# Patient Record
Sex: Female | Born: 2000 | Race: Asian | Hispanic: No | Smoking: Never smoker
Health system: Southern US, Community
[De-identification: ages and names within clinical notes are randomized; demographics above are authoritative.]

## PROBLEM LIST (undated history)

## (undated) DIAGNOSIS — Z789 Other specified health status: Secondary | ICD-10-CM

## (undated) HISTORY — PX: NO PAST SURGERIES: SHX2092

## (undated) HISTORY — DX: Other specified health status: Z78.9

---

## 2020-06-13 ENCOUNTER — Other Ambulatory Visit: Payer: Self-pay

## 2020-06-13 ENCOUNTER — Ambulatory Visit (INDEPENDENT_AMBULATORY_CARE_PROVIDER_SITE_OTHER): Payer: BC Managed Care – PPO | Admitting: *Deleted

## 2020-06-13 ENCOUNTER — Encounter: Payer: Self-pay | Admitting: General Practice

## 2020-06-13 VITALS — BP 123/71 | HR 88 | Temp 98.0°F | Ht 65.0 in | Wt 119.6 lb

## 2020-06-13 DIAGNOSIS — Z3400 Encounter for supervision of normal first pregnancy, unspecified trimester: Secondary | ICD-10-CM | POA: Insufficient documentation

## 2020-06-13 DIAGNOSIS — Z3201 Encounter for pregnancy test, result positive: Secondary | ICD-10-CM

## 2020-06-13 LAB — POCT URINE PREGNANCY: Preg Test, Ur: POSITIVE — AB

## 2020-06-13 NOTE — Progress Notes (Signed)
   PRENATAL INTAKE SUMMARY  Ms. Bloch presents today New OB Nurse Interview.  OB History    Gravida  1   Para      Term      Preterm      AB      Living        SAB      TAB      Ectopic      Multiple      Live Births             I have reviewed the patient's medical, obstetrical, social, and family histories, medications, and available lab results.  SUBJECTIVE She has no unusual complaints. Positive Home UPT  OBJECTIVE Initial nurse interview (New OB)  EDD: 02/04/21 by [facility]MP GA: [redacted]w[redacted]d G1P0  GENERAL APPEARANCE: alert, well appearing, in no apparent distress, oriented to person, place and time   ASSESSMENT Positive UPT Normal pregnancy  PLAN Prenatal care-[facility] to be completed at next visit with provider at [facility].  Clovis Pu, RN

## 2020-06-13 NOTE — Patient Instructions (Addendum)
First Trimester of Pregnancy  The first trimester of pregnancy is from week 1 until the end of week 13 (months 1 through 3). During this time, your baby will begin to develop inside you. At 6-8 weeks, the eyes and face are formed, and the heartbeat can be seen on ultrasound. At the end of 12 weeks, all the baby's organs are formed. Prenatal care is all the medical care you receive before the birth of your baby. Make sure you get good prenatal care and follow all of your doctor's instructions. Follow these instructions at home: Medicines  Take over-the-counter and prescription medicines only as told by your doctor. Some medicines are safe and some medicines are not safe during pregnancy.  Take a prenatal vitamin that contains at least 600 micrograms (mcg) of folic acid.  If you have trouble pooping (constipation), take medicine that will make your stool soft (stool softener) if your doctor approves. Eating and drinking   Eat regular, healthy meals.  Your doctor will tell you the amount of weight gain that is right for you.  Avoid raw meat and uncooked cheese.  If you feel sick to your stomach (nauseous) or throw up (vomit): ? Eat 4 or 5 small meals a day instead of 3 large meals. ? Try eating a few soda crackers. ? Drink liquids between meals instead of during meals.  To prevent constipation: ? Eat foods that are high in fiber, like fresh fruits and vegetables, whole grains, and beans. ? Drink enough fluids to keep your pee (urine) clear or pale yellow. Activity  Exercise only as told by your doctor. Stop exercising if you have cramps or pain in your lower belly (abdomen) or low back.  Do not exercise if it is too hot, too humid, or if you are in a place of great height (high altitude).  Try to avoid standing for long periods of time. Move your legs often if you must stand in one place for a long time.  Avoid heavy lifting.  Wear low-heeled shoes. Sit and stand up  straight.  You can have sex unless your doctor tells you not to. Relieving pain and discomfort  Wear a good support bra if your breasts are sore.  Take warm water baths (sitz baths) to soothe pain or discomfort caused by hemorrhoids. Use hemorrhoid cream if your doctor says it is okay.  Rest with your legs raised if you have leg cramps or low back pain.  If you have puffy, bulging veins (varicose veins) in your legs: ? Wear support hose or compression stockings as told by your doctor. ? Raise (elevate) your feet for 15 minutes, 3-4 times a day. ? Limit salt in your food. Prenatal care  Schedule your prenatal visits by the twelfth week of pregnancy.  Write down your questions. Take them to your prenatal visits.  Keep all your prenatal visits as told by your doctor. This is important. Safety  Wear your seat belt at all times when driving.  Make a list of emergency phone numbers. The list should include numbers for family, friends, the hospital, and police and fire departments. General instructions  Ask your doctor for a referral to a local prenatal class. Begin classes no later than at the start of month 6 of your pregnancy.  Ask for help if you need counseling or if you need help with nutrition. Your doctor can give you advice or tell you where to go for help.  Do not use hot tubs, steam   tubs, steam rooms, or saunas.  Do not douche or use tampons or scented sanitary pads.  Do not cross your legs for long periods of time.  Avoid all herbs and alcohol. Avoid drugs that are not approved by your doctor.  Do not use any tobacco products, including cigarettes, chewing tobacco, and electronic cigarettes. If you need help quitting, ask your doctor. You may get counseling or other support to help you quit.  Avoid cat litter boxes and soil used by cats. These carry germs that can cause birth defects in the baby and can cause a loss of your baby (miscarriage) or stillbirth.  Visit your dentist.  At home, brush your teeth with a soft toothbrush. Be gentle when you floss. Contact a doctor if:  You are dizzy.  You have mild cramps or pressure in your lower belly.  You have a nagging pain in your belly area.  You continue to feel sick to your stomach, you throw up, or you have watery poop (diarrhea).  You have a bad smelling fluid coming from your vagina.  You have pain when you pee (urinate).  You have increased puffiness (swelling) in your face, hands, legs, or ankles. Get help right away if:  You have a fever.  You are leaking fluid from your vagina.  You have spotting or bleeding from your vagina.  You have very bad belly cramping or pain.  You gain or lose weight rapidly.  You throw up blood. It may look like coffee grounds.  You are around people who have [place] measles, fifth disease, or chickenpox.  You have a very bad headache.  You have shortness of breath.  You have any kind of trauma, such as from a fall or a car accident. Summary  The first trimester of pregnancy is from week 1 until the end of week 13 (months 1 through 3).  To take care of yourself and your unborn baby, you will need to eat healthy meals, take medicines only if your doctor tells you to do so, and do activities that are safe for you and your baby.  Keep all follow-up visits as told by your doctor. This is important as your doctor will have to ensure that your baby is healthy and growing well. This information is not intended to replace advice given to you by your health care provider. Make sure you discuss any questions you have with your health care provider. Document Revised: 12/25/2018 Document Reviewed: 09/11/2016 Elsevier Patient Education  2020 [employer].  Warning Signs During Pregnancy A pregnancy lasts about 40 weeks, starting from the first day of your last period until the baby is born. Pregnancy is divided into three phases called trimesters.  The first trimester  refers to week 1 through week 13 of pregnancy.  The second trimester is the start of week 14 through the end of week 27.  The third trimester is the start of week 28 until you deliver your baby. During each trimester of pregnancy, certain signs and symptoms may indicate a problem. Talk with your health care provider about your current health and any medical conditions you have. Make sure you know the symptoms that you should watch for and report. How does this affect me?  Warning signs in the first trimester While some changes during the first trimester may be uncomfortable, most do not represent a serious problem. Let your health care provider know if you have any of the following warning signs in the first trimester:  You cannot eat or drink without vomiting, and this lasts for longer than a day.  You have vaginal bleeding or spotting along with menstrual-like cramping.  You have diarrhea for longer than a day.  You have a fever or other signs of infection, such as: ? Pain or burning when you urinate. ? Foul smelling or thick or yellowish vaginal discharge. Warning signs in the second trimester As your baby grows and changes during the second trimester, there are additional signs and symptoms that may indicate a problem. These include:  Signs and symptoms of infection, including a fever.  Signs or symptoms of a miscarriage or preterm labor, such as regular contractions, menstrual-like cramping, or lower abdominal pain.  Bloody or watery vaginal discharge or obvious vaginal bleeding.  Feeling like your heart is pounding.  Having trouble breathing.  Nausea, vomiting, or diarrhea that lasts for longer than a day.  Craving non-food items, such as clay, chalk, or dirt. This may be a sign of a very treatable medical condition called pica. Later in your second trimester, watch for signs and symptoms of a serious medical condition called preeclampsia.These include:  Changes in your  vision.  A severe headache that does not go away.  Nausea and vomiting. It is also important to notice if your baby stops moving or moves less than usual during this time. Warning signs in the third trimester As you approach the third trimester, your baby is growing and your body is preparing for the birth of your baby. In your third trimester, be sure to let your health care provider know if:  You have signs and symptoms of infection, including a fever.  You have vaginal bleeding.  You notice that your baby is moving less than usual or is not moving.  You have nausea, vomiting, or diarrhea that lasts for longer than a day.  You have a severe headache that does not go away.  You have vision changes, including seeing spots or having blurry or double vision.  You have increased swelling in your hands or face. How does this affect my baby? Throughout your pregnancy, always report any of the warning signs of a problem to your health care provider. This can help prevent complications that may affect your baby, including:  Increased risk for premature birth.  Infection that may be transmitted to your baby.  Increased risk for stillbirth. Contact a health care provider if:  You have any of the warning signs of a problem for the current trimester of your pregnancy.  Any of the following apply to you during any trimester of pregnancy: ? You have strong emotions, such as sadness or anxiety, that interfere with work or personal relationships. ? You feel unsafe in your home and need help finding a safe place to live. ? You are using tobacco products, alcohol, or drugs and you need help to stop. Get help right away if: You have signs or symptoms of labor before 37 weeks of pregnancy. These include:  Contractions that are 5 minutes or less apart, or that increase in frequency, intensity, or length.  Sudden, sharp abdominal pain or low back pain.  Uncontrolled gush or trickle of fluid  from your vagina. Summary  A pregnancy lasts about 40 weeks, starting from the first day of your last period until the baby is born. Pregnancy is divided into three phases called trimesters. Each trimester has warning signs to watch for.  Always report any warning signs to your health care provider in  order to prevent complications that may affect both you and your baby.  Talk with your health care provider about your current health and any medical conditions you have. Make sure you know the symptoms that you should watch for and report. This information is not intended to replace advice given to you by your health care provider. Make sure you discuss any questions you have with your health care provider. Document Revised: 12/23/2018 Document Reviewed: 06/20/2017 Elsevier Patient Education  2020 [employer].  Eating Plan for Pregnant Women While you are pregnant, your body requires additional nutrition to help support your growing baby. You also have a higher need for some vitamins and minerals, such as folic acid, calcium, iron, and vitamin D. Eating a healthy, well-balanced diet is very important for your health and your baby's health. Your need for extra calories varies for the three 36-month segments of your pregnancy (trimesters). For most women, it is recommended to consume:  150 extra calories a day during the first trimester.  300 extra calories a day during the second trimester.  300 extra calories a day during the third trimester. What are tips for following this plan?   Do not try to lose weight or go on a diet during pregnancy.  Limit your overall intake of foods that have "empty calories." These are foods that have little nutritional value, such as sweets, desserts, candies, and sugar-sweetened beverages.  Eat a variety of foods (especially fruits and vegetables) to get a full range of vitamins and minerals.  Take a prenatal vitamin to help meet your additional vitamin and  mineral needs during pregnancy, specifically for folic acid, iron, calcium, and vitamin D.  Remember to stay active. Ask your health care provider what types of exercise and activities are safe for you.  Practice good food safety and cleanliness. Wash your hands before you eat and after you prepare raw meat. Wash all fruits and vegetables well before peeling or eating. Taking these actions can help to prevent food-borne illnesses that can be very dangerous to your baby, such as listeriosis. Ask your health care provider for more information about listeriosis. What does 150 extra calories look like? Healthy options that provide 150 extra calories each day could be any of the following:  6-8 oz (170-230 g) of plain low-fat yogurt with  cup of berries.  1 apple with 2 teaspoons (11 g) of peanut butter.  Cut-up vegetables with  cup (60 g) of hummus.  8 oz (230 mL) or 1 cup of low-fat chocolate milk.  1 stick of string cheese with 1 medium orange.  1 peanut butter and jelly sandwich that is made with one slice of whole-wheat bread and 1 tsp (5 g) of peanut butter. For 300 extra calories, you could eat two of those healthy options each day. What is a healthy amount of weight to gain? The right amount of weight gain for you is based on your BMI before you became pregnant. If your BMI:  Was less than 18 (underweight), you should gain 28-40 lb (13-18 kg).  Was 18-24.9 (normal), you should gain 25-35 lb (11-16 kg).  Was 25-29.9 (overweight), you should gain 15-25 lb (7-11 kg).  Was 30 or greater (obese), you should gain 11-20 lb (5-9 kg). What if I am having twins or multiples? Generally, if you are carrying twins or multiples:  You may need to eat 300-600 extra calories a day.  The recommended range for total weight gain is 25-54 lb (11-25 kg),  depending on your BMI before pregnancy.  Talk with your health care provider to find out about nutritional needs, weight gain, and exercise that  is right for you. What foods can I eat?  Fruits All fruits. Eat a variety of colors and types of fruit. Remember to wash your fruits well before peeling or eating. Vegetables All vegetables. Eat a variety of colors and types of vegetables. Remember to wash your vegetables well before peeling or eating. Grains All grains. Choose whole grains, such as whole-wheat bread, oatmeal, or brown rice. Meats and other protein foods Lean meats, including chicken, [place], fish, and lean cuts of beef, veal, or pork. If you eat fish or seafood, choose options that are higher in omega-3 fatty acids and lower in mercury, such as salmon, herring, mussels, trout, sardines, pollock, shrimp, crab, and lobster. Tofu. Tempeh. Beans. Eggs. Peanut butter and other nut butters. Make sure that all meats, poultry, and eggs are cooked to food-safe temperatures or "well-done." Two or more servings of fish are recommended each week in order to get the most benefits from omega-3 fatty acids that are found in seafood. Choose fish that are lower in mercury. You can find more information online:  PumpkinSearch.com.ee Dairy Pasteurized milk and milk alternatives (such as almond milk). Pasteurized yogurt and pasteurized cheese. Cottage cheese. Sour cream. Beverages Water. Juices that contain 100% fruit juice or vegetable juice. Caffeine-free teas and decaffeinated coffee. Drinks that contain caffeine are okay to drink, but it is better to avoid caffeine. Keep your total caffeine intake to less than 200 mg each day (which is 12 oz or 355 mL of coffee, tea, or soda) or the limit as told by your health care provider. Fats and oils Fats and oils are okay to include in moderation. Sweets and desserts Sweets and desserts are okay to include in moderation. Seasoning and other foods All pasteurized condiments. The items listed above may not be a complete list of foods and beverages you can eat. Contact a dietitian for more information. What  foods are not recommended? Fruits Unpasteurized fruit juices. Vegetables Raw (unpasteurized) vegetable juices. Meats and other protein foods Lunch meats, bologna, hot dogs, or other deli meats. (If you must eat those meats, reheat them until they are steaming hot.) Refrigerated pat, meat spreads from a meat counter, smoked seafood that is found in the refrigerated section of a store. Raw or undercooked meats, poultry, and eggs. Raw fish, such as sushi or sashimi. Fish that have high mercury content, such as tilefish, shark, swordfish, and king mackerel. To learn more about mercury in fish, talk with your health care provider or look for online resources, such as:  PumpkinSearch.com.ee Dairy Raw (unpasteurized) milk and any foods that have raw milk in them. Soft cheeses, such as feta, queso blanco, queso fresco, Brie, Camembert cheeses, blue-veined cheeses, and Panela cheese (unless it is made with pasteurized milk, which must be stated on the label). Beverages Alcohol. Sugar-sweetened beverages, such as sodas, teas, or energy drinks. Seasoning and other foods Homemade fermented foods and drinks, such as pickles, sauerkraut, or kombucha drinks. (Store-bought pasteurized versions of these are okay.) Salads that are made in a store or deli, such as ham salad, chicken salad, egg salad, tuna salad, and seafood salad. The items listed above may not be a complete list of foods and beverages you should avoid. Contact a dietitian for more information. Where to find more information To calculate the number of calories you need based on your height, weight,  and activity level, you can use an online calculator such as:  PackageNews.is To calculate how much weight you should gain during pregnancy, you can use an online pregnancy weight gain calculator such as:  http://jones-berg.com/ Summary  While you are pregnant, your body requires additional nutrition to  help support your growing baby.  Eat a variety of foods, especially fruits and vegetables to get a full range of vitamins and minerals.  Practice good food safety and cleanliness. Wash your hands before you eat and after you prepare raw meat. Wash all fruits and vegetables well before peeling or eating. Taking these actions can help to prevent food-borne illnesses, such as listeriosis, that can be very dangerous to your baby.  Do not eat raw meat or fish. Do not eat fish that have high mercury content, such as tilefish, shark, swordfish, and king mackerel. Do not eat unpasteurized (raw) dairy.  Take a prenatal vitamin to help meet your additional vitamin and mineral needs during pregnancy, specifically for folic acid, iron, calcium, and vitamin D. This information is not intended to replace advice given to you by your health care provider. Make sure you discuss any questions you have with your health care provider. Document Revised: 01/22/2019 Document Reviewed: 05/31/2017 Elsevier Patient Education  2020 [employer].

## 2020-07-11 ENCOUNTER — Other Ambulatory Visit (HOSPITAL_COMMUNITY)
Admission: RE | Admit: 2020-07-11 | Discharge: 2020-07-11 | Disposition: A | Discharge status: Home / Self Care | Payer: BC Managed Care – PPO | Source: Ambulatory Visit | Attending: Advanced Practice Midwife | Admitting: Advanced Practice Midwife

## 2020-07-11 ENCOUNTER — Other Ambulatory Visit: Payer: Self-pay

## 2020-07-11 ENCOUNTER — Ambulatory Visit (INDEPENDENT_AMBULATORY_CARE_PROVIDER_SITE_OTHER): Payer: BC Managed Care – PPO

## 2020-07-11 ENCOUNTER — Encounter: Payer: Self-pay | Admitting: General Practice

## 2020-07-11 ENCOUNTER — Ambulatory Visit (INDEPENDENT_AMBULATORY_CARE_PROVIDER_SITE_OTHER): Payer: BC Managed Care – PPO | Admitting: Advanced Practice Midwife

## 2020-07-11 VITALS — BP 121/83 | HR 92 | Wt 118.0 lb

## 2020-07-11 DIAGNOSIS — O219 Vomiting of pregnancy, unspecified: Secondary | ICD-10-CM

## 2020-07-11 DIAGNOSIS — Z3400 Encounter for supervision of normal first pregnancy, unspecified trimester: Secondary | ICD-10-CM

## 2020-07-11 DIAGNOSIS — O368390 Maternal care for abnormalities of the fetal heart rate or rhythm, unspecified trimester, not applicable or unspecified: Secondary | ICD-10-CM

## 2020-07-11 DIAGNOSIS — Z3A19 19 weeks gestation of pregnancy: Secondary | ICD-10-CM

## 2020-07-11 DIAGNOSIS — Z3A10 10 weeks gestation of pregnancy: Secondary | ICD-10-CM

## 2020-07-11 DIAGNOSIS — Z3401 Encounter for supervision of normal first pregnancy, first trimester: Secondary | ICD-10-CM | POA: Insufficient documentation

## 2020-07-11 DIAGNOSIS — Z3A09 9 weeks gestation of pregnancy: Secondary | ICD-10-CM

## 2020-07-11 MED ORDER — METOCLOPRAMIDE HCL 10 MG PO TABS: 10.0000 mg | ORAL_TABLET | Freq: Four times a day (QID) | ORAL | 2 refills | Status: DC

## 2020-07-11 MED ORDER — BLOOD PRESSURE KIT DEVI: 1.0000 | 0 refills | Status: DC

## 2020-07-11 NOTE — Patient Instructions (Signed)
First Trimester of Pregnancy The first trimester of pregnancy is from week 1 until the end of week 13 (months 1 through 3). A week after a sperm fertilizes an egg, the egg will implant on the wall of the uterus. This embryo will begin to develop into a baby. Genes from you and your partner will form the baby. The female genes will determine whether the baby will be a boy or a girl. At 6-8 weeks, the eyes and face will be formed, and the heartbeat can be seen on ultrasound. At the end of 12 weeks, all the baby's organs will be formed. Now that you are pregnant, you will want to do everything you can to have a healthy baby. Two of the most important things are to get good prenatal care and to follow your health care provider's instructions. Prenatal care is all the medical care you receive before the baby's birth. This care will help prevent, find, and treat any problems during the pregnancy and childbirth. Body changes during your first trimester Your body goes through many changes during pregnancy. The changes vary from woman to woman.  You may gain or lose a couple of pounds at first.  You may feel sick to your stomach (nauseous) and you may throw up (vomit). If the vomiting is uncontrollable, call your health care provider.  You may tire easily.  You may develop headaches that can be relieved by medicines. All medicines should be approved by your health care provider.  You may urinate more often. Painful urination may mean you have a bladder infection.  You may develop heartburn as a result of your pregnancy.  You may develop constipation because certain hormones are causing the muscles that push stool through your intestines to slow down.  You may develop hemorrhoids or swollen veins (varicose veins).  Your breasts may begin to grow larger and become tender. Your nipples may stick out more, and the tissue that surrounds them (areola) may become darker.  Your gums may bleed and may be  sensitive to brushing and flossing.  Dark spots or blotches (chloasma, mask of pregnancy) may develop on your face. This will likely fade after the baby is born.  Your menstrual periods will stop.  You may have a loss of appetite.  You may develop cravings for certain kinds of food.  You may have changes in your emotions from day to day, such as being excited to be pregnant or being concerned that something may go wrong with the pregnancy and baby.  You may have more vivid and strange dreams.  You may have changes in your hair. These can include thickening of your hair, rapid growth, and changes in texture. Some women also have hair loss during or after pregnancy, or hair that feels dry or thin. Your hair will most likely return to normal after your baby is born. What to expect at prenatal visits During a routine prenatal visit:  You will be weighed to make sure you and the baby are growing normally.  Your blood pressure will be taken.  Your abdomen will be measured to track your baby's growth.  The fetal heartbeat will be listened to between weeks 10 and 14 of your pregnancy.  Test results from any previous visits will be discussed. Your health care provider may ask you:  How you are feeling.  If you are feeling the baby move.  If you have had any abnormal symptoms, such as leaking fluid, bleeding, severe headaches, or abdominal   cramping.  If you are using any tobacco products, including cigarettes, chewing tobacco, and electronic cigarettes.  If you have any questions. Other tests that may be performed during your first trimester include:  Blood tests to find your blood type and to check for the presence of any previous infections. The tests will also be used to check for low iron levels (anemia) and protein on red blood cells (Rh antibodies). Depending on your risk factors, or if you previously had diabetes during pregnancy, you may have tests to check for high blood sugar  that affects pregnant women (gestational diabetes).  Urine tests to check for infections, diabetes, or protein in the urine.  An ultrasound to confirm the proper growth and development of the baby.  Fetal screens for spinal cord problems (spina bifida) and Down syndrome.  HIV (human immunodeficiency virus) testing. Routine prenatal testing includes screening for HIV, unless you choose not to have this test.  You may need other tests to make sure you and the baby are doing well. Follow these instructions at home: Medicines  Follow your health care provider's instructions regarding medicine use. Specific medicines may be either safe or unsafe to take during pregnancy.  Take a prenatal vitamin that contains at least 600 micrograms (mcg) of folic acid.  If you develop constipation, try taking a stool softener if your health care provider approves. Eating and drinking   Eat a balanced diet that includes fresh fruits and vegetables, whole grains, good sources of protein such as meat, eggs, or tofu, and low-fat dairy. Your health care provider will help you determine the amount of weight gain that is right for you.  Avoid raw meat and uncooked cheese. These carry germs that can cause birth defects in the baby.  Eating four or five small meals rather than three large meals a day may help relieve nausea and vomiting. If you start to feel nauseous, eating a few soda crackers can be helpful. Drinking liquids between meals, instead of during meals, also seems to help ease nausea and vomiting.  Limit foods that are high in fat and processed sugars, such as fried and sweet foods.  To prevent constipation: ? Eat foods that are high in fiber, such as fresh fruits and vegetables, whole grains, and beans. ? Drink enough fluid to keep your urine clear or pale yellow. Activity  Exercise only as directed by your health care provider. Most women can continue their usual exercise routine during  pregnancy. Try to exercise for 30 minutes at least 5 days a week. Exercising will help you: ? Control your weight. ? Stay in shape. ? Be prepared for labor and delivery.  Experiencing pain or cramping in the lower abdomen or lower back is a good sign that you should stop exercising. Check with your health care provider before continuing with normal exercises.  Try to avoid standing for long periods of time. Move your legs often if you must stand in one place for a long time.  Avoid heavy lifting.  Wear low-heeled shoes and practice good posture.  You may continue to have sex unless your health care provider tells you not to. Relieving pain and discomfort  Wear a good support bra to relieve breast tenderness.  Take warm sitz baths to soothe any pain or discomfort caused by hemorrhoids. Use hemorrhoid cream if your health care provider approves.  Rest with your legs elevated if you have leg cramps or low back pain.  If you develop varicose veins in   your legs, wear support hose. Elevate your feet for 15 minutes, 3-4 times a day. Limit salt in your diet. Prenatal care  Schedule your prenatal visits by the twelfth week of pregnancy. They are usually scheduled monthly at first, then more often in the last 2 months before delivery.  Write down your questions. Take them to your prenatal visits.  Keep all your prenatal visits as told by your health care provider. This is important. Safety  Wear your seat belt at all times when driving.  Make a list of emergency phone numbers, including numbers for family, friends, the hospital, and police and fire departments. General instructions  Ask your health care provider for a referral to a local prenatal education class. Begin classes no later than the beginning of month 6 of your pregnancy.  Ask for help if you have counseling or nutritional needs during pregnancy. Your health care provider can offer advice or refer you to specialists for help  with various needs.  Do not use hot tubs, steam rooms, or saunas.  Do not douche or use tampons or scented sanitary pads.  Do not cross your legs for long periods of time.  Avoid cat litter boxes and soil used by cats. These carry germs that can cause birth defects in the baby and possibly loss of the fetus by miscarriage or stillbirth.  Avoid all smoking, herbs, alcohol, and medicines not prescribed by your health care provider. Chemicals in these products affect the formation and growth of the baby.  Do not use any products that contain nicotine or tobacco, such as cigarettes and e-cigarettes. If you need help quitting, ask your health care provider. You may receive counseling support and other resources to help you quit.  Schedule a dentist appointment. At home, brush your teeth with a soft toothbrush and be gentle when you floss. Contact a health care provider if:  You have dizziness.  You have mild pelvic cramps, pelvic pressure, or nagging pain in the abdominal area.  You have persistent nausea, vomiting, or diarrhea.  You have a bad smelling vaginal discharge.  You have pain when you urinate.  You notice increased swelling in your face, hands, legs, or ankles.  You are exposed to fifth disease or chickenpox.  You are exposed to German measles (rubella) and have never had it. Get help right away if:  You have a fever.  You are leaking fluid from your vagina.  You have spotting or bleeding from your vagina.  You have severe abdominal cramping or pain.  You have rapid weight gain or loss.  You vomit blood or material that looks like coffee grounds.  You develop a severe headache.  You have shortness of breath.  You have any kind of trauma, such as from a fall or a car accident. Summary  The first trimester of pregnancy is from week 1 until the end of week 13 (months 1 through 3).  Your body goes through many changes during pregnancy. The changes vary from  woman to woman.  You will have routine prenatal visits. During those visits, your health care provider will examine you, discuss any test results you may have, and talk with you about how you are feeling. This information is not intended to replace advice given to you by your health care provider. Make sure you discuss any questions you have with your health care provider. Document Revised: 08/16/2017 Document Reviewed: 08/15/2016 Elsevier Patient Education  2020 [employer]  

## 2020-07-11 NOTE — Progress Notes (Signed)
Subjective:   Inda Mcglothen is a 19 y.o. G1P0 at [redacted]w[redacted]d by LMP being seen today for her first obstetrical visit.  Her obstetrical history is significant for none and has Supervision of normal first pregnancy, antepartum on their problem list.. Patient does intend to breast feed. Pregnancy history fully reviewed.  Patient reports nausea.  HISTORY: OB History  Gravida Para Term Preterm AB Living  1 0 0 0 0 0  SAB TAB Ectopic Multiple Live Births  0 0 0 0 0    # Outcome Date GA Lbr Len/2nd Weight Sex Delivery Anes PTL Lv  1 Current            Past Medical History:  Diagnosis Date  . Medical history non-contributory    Past Surgical History:  Procedure Laterality Date  . NO PAST SURGERIES     No family history on file. Social History   Tobacco Use  . Smoking status: Never Smoker  . Smokeless tobacco: Never Used  Vaping Use  . Vaping Use: Never used  Substance Use Topics  . Alcohol use: Never  . Drug use: Never   No Known Allergies No current outpatient medications on file prior to visit.   No current facility-administered medications on file prior to visit.     Indications for ASA therapy (per uptodate) One of the following: Previous pregnancy with preeclampsia, especially early onset and with an adverse outcome No Multifetal gestation No Chronic hypertension No Type 1 or 2 diabetes mellitus No Chronic kidney disease No Autoimmune disease (antiphospholipid syndrome, systemic lupus erythematosus) No   Two or more of the following: Nulliparity Yes Obesity (body mass index >30 kg/m2) No Family history of preeclampsia in mother or sister No Age ?35 years No Sociodemographic characteristics (African American race, low socioeconomic level) No Personal risk factors (eg, previous pregnancy with low birth weight or small for gestational age infant, previous adverse pregnancy outcome [eg, stillbirth], interval >10 years between pregnancies) No  Indications for early 1  hour GTT (per uptodate)  BMI >25 (>23 in Asian women) : BMI is 19.  No further indications.  Exam   Vitals:   07/11/20 0928  BP: 121/83  Pulse: 92  Weight: 118 lb (53.5 kg)      Uterus:     Pelvic Exam: Perineum: no hemorrhoids, normal perineum   Vulva: normal external genitalia, no lesions   Vagina:  normal mucosa, normal discharge   Cervix: no lesions and normal, pap smear done.    Adnexa: normal adnexa and no mass, fullness, tenderness   Bony Pelvis: average  System: General: well-developed, well-nourished female in no acute distress   Breast:  normal appearance, no masses or tenderness   Skin: normal coloration and turgor, no rashes   Neurologic: oriented, normal, negative, normal mood   Extremities: normal strength, tone, and muscle mass, ROM of all joints is normal   HEENT PERRLA, extraocular movement intact and sclera clear, anicteric   Mouth/Teeth mucous membranes moist, pharynx normal without lesions and dental hygiene good   Neck supple and no masses   Cardiovascular: regular rate and rhythm   Respiratory:  no respiratory distress, normal breath sounds   Abdomen: soft, non-tender; bowel sounds normal; no masses,  no organomegaly     Assessment:   Pregnancy: G1P0 Patient Active Problem List   Diagnosis Date Noted  . Supervision of normal first pregnancy, antepartum 06/13/2020     Plan:  1. Supervision of normal first pregnancy, antepartum --Anticipatory guidance  about next visits/weeks of pregnancy given. --Next visit in 4 weeks in office for AFP  - Cytology - PAP( [facility]) - Culture, OB Urine - CBC/D/Plt+RPR+Rh+ABO+Rub Ab... - Enroll Patient in [employer] - Blood Pressure Monitoring (BLOOD PRESSURE KIT) DEVI; 1 kit by Does not apply route once a week. Check Blood Pressure regularly and record readings into the [employer] App.  Large Cuff.  DX O90.0  Dispense: 1 each; Refill: 0 - [employer] Schedule Optimization - Cervicovaginal ancillary only(  [facility])  2. [redacted] weeks gestation of pregnancy   3. Ultrasound scan done for inability to hear fetal heart tones --Bedside US reveals live IUP but difficult to see FHR and unable to record rate, Limited OB wnl - US OB Limited; Future  5. Nausea and vomiting during pregnancy prior to [redacted] weeks gestation --Mostly nausea, limited vomiting, pt with mild h/a so will try Reglan for both, increase PO fluids/food daily.  - metoCLOPramide (REGLAN) 10 MG tablet; Take 1 tablet (10 mg total) by mouth 4 (four) times daily.  Dispense: 60 tablet; Refill: 2    Initial labs drawn. Continue prenatal vitamins. Discussed and offered genetic screening options, including Quad screen/AFP, NIPS testing, and option to decline testing. Benefits/risks/alternatives reviewed. Pt aware that anatomy US is form of genetic screening with lower accuracy in detecting trisomies than blood work.  Pt chooses genetic screening today. NIPS: ordered. Ultrasound discussed; fetal anatomic survey: ordered. Problem list reviewed and updated. The nature of [facility] with multiple MDs and other Advanced Practice Providers was explained to patient; also emphasized that residents, students are part of our team. Routine obstetric precautions reviewed. Return in about 4 weeks (around 08/08/2020).   Fatima Blank, CNM 07/11/20 12:30 PM

## 2020-07-12 LAB — CBC/D/PLT+RPR+RH+ABO+RUB AB...
Antibody Screen: NEGATIVE
Basophils Absolute: 0.1 10*3/uL (ref 0.0–0.2)
Basos: 1 %
EOS (ABSOLUTE): 0.1 10*3/uL (ref 0.0–0.4)
Eos: 1 %
HCV Ab: <0.1 s/co ratio (ref 0.0–0.9)
HIV Screen 4th Generation wRfx: NONREACTIVE
Hematocrit: 40.5 % (ref 34.0–46.6)
Hemoglobin: 13.2 g/dL (ref 11.1–15.9)
Hepatitis B Surface Ag: NEGATIVE
Immature Grans (Abs): 0 10*3/uL (ref 0.0–0.1)
Immature Granulocytes: 0 %
Lymphocytes Absolute: 1.9 10*3/uL (ref 0.7–3.1)
Lymphs: 18 %
MCH: 26.2 pg — ABNORMAL LOW (ref 26.6–33.0)
MCHC: 32.6 g/dL (ref 31.5–35.7)
MCV: 80 fL (ref 79–97)
Monocytes Absolute: 0.5 10*3/uL (ref 0.1–0.9)
Monocytes: 5 %
Neutrophils Absolute: 7.9 10*3/uL — ABNORMAL HIGH (ref 1.4–7.0)
Neutrophils: 75 %
Platelets: 269 10*3/uL (ref 150–450)
RBC: 5.04 x10E6/uL (ref 3.77–5.28)
RDW: 13.8 % (ref 11.7–15.4)
RPR Ser Ql: NONREACTIVE
Rh Factor: POSITIVE
Rubella Antibodies, IGG: 2.13 index (ref >0.99)
WBC: 10.5 10*3/uL (ref 3.4–10.8)

## 2020-07-12 LAB — CERVICOVAGINAL ANCILLARY ONLY
Bacterial Vaginitis (gardnerella): NEGATIVE
Candida Glabrata: NEGATIVE
Candida Vaginitis: NEGATIVE
Chlamydia: NEGATIVE
Comment: NEGATIVE
Comment: NEGATIVE
Comment: NEGATIVE
Comment: NEGATIVE
Comment: NEGATIVE
Comment: NORMAL
Neisseria Gonorrhea: NEGATIVE
Trichomonas: NEGATIVE

## 2020-07-12 LAB — CYTOLOGY - PAP
Adequacy: ABSENT
Comment: NEGATIVE
Diagnosis: NEGATIVE
High risk HPV: NEGATIVE

## 2020-07-12 LAB — HCV INTERPRETATION

## 2020-07-14 LAB — URINE CULTURE, OB REFLEX

## 2020-07-14 LAB — CULTURE, OB URINE

## 2020-07-18 ENCOUNTER — Encounter: Payer: Self-pay | Admitting: Advanced Practice Midwife

## 2020-07-21 ENCOUNTER — Encounter: Payer: Self-pay | Admitting: Advanced Practice Midwife

## 2020-07-23 ENCOUNTER — Encounter: Payer: Self-pay | Admitting: Advanced Practice Midwife

## 2020-07-23 DIAGNOSIS — D563 Thalassemia minor: Secondary | ICD-10-CM | POA: Insufficient documentation

## 2020-07-23 DIAGNOSIS — Z148 Genetic carrier of other disease: Secondary | ICD-10-CM | POA: Insufficient documentation

## 2020-07-23 DIAGNOSIS — D582 Other hemoglobinopathies: Secondary | ICD-10-CM | POA: Insufficient documentation

## 2020-08-08 ENCOUNTER — Ambulatory Visit (INDEPENDENT_AMBULATORY_CARE_PROVIDER_SITE_OTHER): Payer: BC Managed Care – PPO | Admitting: Advanced Practice Midwife

## 2020-08-08 ENCOUNTER — Other Ambulatory Visit: Payer: Self-pay

## 2020-08-08 VITALS — BP 118/81 | HR 86 | Wt 120.0 lb

## 2020-08-08 DIAGNOSIS — D582 Other hemoglobinopathies: Secondary | ICD-10-CM

## 2020-08-08 DIAGNOSIS — D563 Thalassemia minor: Secondary | ICD-10-CM

## 2020-08-08 DIAGNOSIS — Z3400 Encounter for supervision of normal first pregnancy, unspecified trimester: Secondary | ICD-10-CM

## 2020-08-08 DIAGNOSIS — Z3A13 13 weeks gestation of pregnancy: Secondary | ICD-10-CM

## 2020-08-08 MED ORDER — PRENATE MINI 18-0.6-0.4-350 MG PO CAPS: 1.0000 | ORAL_CAPSULE | Freq: Every day | ORAL | 11 refills | Status: DC

## 2020-08-08 NOTE — Progress Notes (Signed)
ROB, concern about Hemo E and Alpha Thal.

## 2020-08-08 NOTE — Progress Notes (Signed)
   PRENATAL VISIT NOTE  Subjective:  Jasmin Herman is a 19 y.o. G1P0 at [redacted]w[redacted]d being seen today for ongoing prenatal care.  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  Patient reports no complaints.  Contractions: Not present. Vag. Bleeding: None.   . Denies leaking of fluid.   The following portions of the patient's history were reviewed and updated as appropriate: allergies, current medications, past family history, past medical history, past social history, past surgical history and problem list.   Objective:   Vitals:   08/08/20 1334  BP: 118/81  Pulse: 86  Weight: 120 lb (54.4 kg)    Fetal Status: Fetal Heart Rate (bpm): 160         General:  Alert, oriented and cooperative. Patient is in no acute distress.  Skin: Skin is warm and dry. No rash noted.   Cardiovascular: Normal heart rate noted  Respiratory: Normal respiratory effort, no problems with respiration noted  Abdomen: Soft, gravid, appropriate for gestational age.  Pain/Pressure: Absent     Pelvic: Cervical exam deferred        Extremities: Normal range of motion.  Edema: None  Mental Status: Normal mood and affect. Normal behavior. Normal judgment and thought content.   Assessment and Plan:  Pregnancy: G1P0 at [redacted]w[redacted]d 1. Supervision of normal first pregnancy, antepartum --Anticipatory guidance about next visits/weeks of pregnancy given. --Next visit in 4 weeks in person for AFP  2. [redacted] weeks gestation of pregnancy   3. Alpha thalassemia silent carrier --Reviewed lab results with pt. Pt declines genetic counseling for now.    4. Hemoglobin E (hb-e) (HCC)   Preterm labor symptoms and general obstetric precautions including but not limited to vaginal bleeding, contractions, leaking of fluid and fetal movement were reviewed in detail  with the patient. Please refer to After Visit Summary for other counseling recommendations.   Return in about 4 weeks (around 09/05/2020).  Future Appointments  Date Time Provider Department Center  09/05/2020  3:00 PM Nugent, Odie Sera, NP [facility] None  09/19/2020 12:45 PM [id number]    Sharen Counter, CNM

## 2020-08-10 ENCOUNTER — Encounter: Payer: Self-pay | Admitting: Advanced Practice Midwife

## 2020-08-10 DIAGNOSIS — Z3400 Encounter for supervision of normal first pregnancy, unspecified trimester: Secondary | ICD-10-CM

## 2020-08-10 MED ORDER — PNV-SELECT 27-0.6-0.4 MG PO TABS: 1.0000 | ORAL_TABLET | Freq: Every day | ORAL | 11 refills | Status: DC

## 2020-09-05 ENCOUNTER — Ambulatory Visit (INDEPENDENT_AMBULATORY_CARE_PROVIDER_SITE_OTHER): Payer: BC Managed Care – PPO | Admitting: Women's Health

## 2020-09-05 ENCOUNTER — Other Ambulatory Visit: Payer: Self-pay

## 2020-09-05 VITALS — BP 116/88 | HR 105 | Wt 122.2 lb

## 2020-09-05 DIAGNOSIS — D582 Other hemoglobinopathies: Secondary | ICD-10-CM

## 2020-09-05 DIAGNOSIS — Z3400 Encounter for supervision of normal first pregnancy, unspecified trimester: Secondary | ICD-10-CM

## 2020-09-05 DIAGNOSIS — Z148 Genetic carrier of other disease: Secondary | ICD-10-CM

## 2020-09-05 DIAGNOSIS — D563 Thalassemia minor: Secondary | ICD-10-CM

## 2020-09-05 NOTE — Patient Instructions (Addendum)
Maternity Assessment Unit (MAU)  The Maternity Assessment Unit (MAU) is located at the [facility] at [facility]. The address is: [address], [place], [place], [place] [address]. Please see map below for additional directions.    The Maternity Assessment Unit is designed to help you during your pregnancy, and for up to 6 weeks after delivery, with any pregnancy- or postpartum-related emergencies, if you think you are in labor, or if your water has broken. For example, if you experience nausea and vomiting, vaginal bleeding, severe abdominal or pelvic pain, elevated blood pressure or other problems related to your pregnancy or postpartum time, please come to the Maternity Assessment Unit for assistance.        Alpha-Fetoprotein Test Why am I having this test? The alpha-fetoprotein test is most commonly used in pregnant women to help screen for birth defects in their unborn baby. It can be used to screen for birth defects, such as chromosome (DNA) abnormalities, problems with the brain or spinal cord, or problems with the abdominal wall of the unborn baby (fetus). The alpha-fetoprotein test may also be done for men or non-pregnant women to check for certain cancers. What is being tested? This test measures the amount of alpha-fetoprotein (AFP) in your blood. AFP is a protein that is made by the liver. Levels can be detected in the mother's blood during pregnancy, starting at 10 weeks and peaking at 16-18 weeks of the pregnancy. Abnormal levels can sometimes be a sign of a birth defect in the baby. Certain cancers can cause a high level of AFP in men and non-pregnant women. What kind of sample is taken?  A blood sample is required for this test. It is usually collected by inserting a needle into a blood vessel. How are the results reported? Your test results will be reported as values. Your health care provider will compare your results to normal ranges  that were established after testing a large group of people (reference values). Reference values may vary among labs and hospitals. For this test, common reference values are:  Adult: Less than 40 ng/mL or less than 40 mcg/L (SI units).  Child younger than 1 year: Less than 30 ng/mL. If you are pregnant, the values may also vary based on how long you have been pregnant. What do the results mean? Results that are above the reference values in pregnant women may indicate the following for the baby:  Neural tube defects, such as abnormalities of the spinal cord or brain.  Abdominal wall defects.  Multiple pregnancy such as twins.  Fetal distress or fetal death. Results that are above the reference values in men or non-pregnant women may indicate:  Reproductive cancers, such as ovarian or testicular cancer.  Liver cancer.  Liver cell death.  Other types of cancer. Very low levels of AFP in pregnant women may indicate the following for the baby:  Down syndrome.  Fetal death. Talk with your health care provider about what your results mean. Questions to ask your health care provider Ask your health care provider, or the department that is doing the test:  When will my results be ready?  How will I get my results?  What are my treatment options?  What other tests do I need?  What are my next steps? Summary  The alpha-fetoprotein test is done on pregnant women to help screen for birth defects in their unborn baby.  Certain cancers can cause a high level of AFP in  men and non-pregnant women.  For this test, a blood sample is usually collected by inserting a needle into a blood vessel.  Talk with your health care provider about what your results mean. This information is not intended to replace advice given to you by your health care provider. Make sure you discuss any questions you have with your health care provider. Document Revised: 08/16/2017 Document Reviewed:  04/09/2017 Elsevier Patient Education  2020 [employer].        Contraception Choices Contraception, also called birth control, refers to methods or devices that prevent pregnancy. Hormonal methods Contraceptive implant  A contraceptive implant is a thin, plastic tube that contains a hormone. It is inserted into the upper part of the arm. It can remain in place for up to 3 years. Progestin-only injections Progestin-only injections are injections of progestin, a synthetic form of the hormone progesterone. They are given every 3 months by a health care provider. Birth control pills  Birth control pills are pills that contain hormones that prevent pregnancy. They must be taken once a day, preferably at the same time each day. Birth control patch  The birth control patch contains hormones that prevent pregnancy. It is placed on the skin and must be changed once a week for three weeks and removed on the fourth week. A prescription is needed to use this method of contraception. Vaginal ring  A vaginal ring contains hormones that prevent pregnancy. It is placed in the vagina for three weeks and removed on the fourth week. After that, the process is repeated with a new ring. A prescription is needed to use this method of contraception. Emergency contraceptive Emergency contraceptives prevent pregnancy after unprotected sex. They come in pill form and can be taken up to 5 days after sex. They work best the sooner they are taken after having sex. Most emergency contraceptives are available without a prescription. This method should not be used as your only form of birth control. Barrier methods Female condom  A female condom is a thin sheath that is worn over the penis during sex. Condoms keep sperm from going inside a woman's body. They can be used with a spermicide to increase their effectiveness. They should be disposed after a single use. Female condom  A female condom is a soft,  loose-fitting sheath that is put into the vagina before sex. The condom keeps sperm from going inside a woman's body. They should be disposed after a single use. Diaphragm  A diaphragm is a soft, dome-shaped barrier. It is inserted into the vagina before sex, along with a spermicide. The diaphragm blocks sperm from entering the uterus, and the spermicide kills sperm. A diaphragm should be left in the vagina for 6-8 hours after sex and removed within 24 hours. A diaphragm is prescribed and fitted by a health care provider. A diaphragm should be replaced every 1-2 years, after giving birth, after gaining more than 15 lb (6.8 kg), and after pelvic surgery. Cervical cap  A cervical cap is a round, soft latex or plastic cup that fits over the cervix. It is inserted into the vagina before sex, along with spermicide. It blocks sperm from entering the uterus. The cap should be left in place for 6-8 hours after sex and removed within 48 hours. A cervical cap must be prescribed and fitted by a health care provider. It should be replaced every 2 years. Sponge  A sponge is a soft, circular piece of polyurethane foam with spermicide on it.  The sponge helps block sperm from entering the uterus, and the spermicide kills sperm. To use it, you make it wet and then insert it into the vagina. It should be inserted before sex, left in for at least 6 hours after sex, and removed and thrown away within 30 hours. Spermicides Spermicides are chemicals that kill or block sperm from entering the cervix and uterus. They can come as a cream, jelly, suppository, foam, or tablet. A spermicide should be inserted into the vagina with an applicator at least 10-15 minutes before sex to allow time for it to work. The process must be repeated every time you have sex. Spermicides do not require a prescription. Intrauterine contraception Intrauterine device (IUD) An IUD is a T-shaped device that is put in a woman's uterus. There are two  types:  Hormone IUD.This type contains progestin, a synthetic form of the hormone progesterone. This type can stay in place for 3-5 years.  Copper IUD.This type is wrapped in copper wire. It can stay in place for 10 years.  Permanent methods of contraception Female tubal ligation In this method, a woman's fallopian tubes are sealed, tied, or blocked during surgery to prevent eggs from traveling to the uterus. Hysteroscopic sterilization In this method, a small, flexible insert is placed into each fallopian tube. The inserts cause scar tissue to form in the fallopian tubes and block them, so sperm cannot reach an egg. The procedure takes about 3 months to be effective. Another form of birth control must be used during those 3 months. Female sterilization This is a procedure to tie off the tubes that carry sperm (vasectomy). After the procedure, the man can still ejaculate fluid (semen). Natural planning methods Natural family planning In this method, a couple does not have sex on days when the woman could become pregnant. Calendar method This means keeping track of the length of each menstrual cycle, identifying the days when pregnancy can happen, and not having sex on those days. Ovulation method In this method, a couple avoids sex during ovulation. Symptothermal method This method involves not having sex during ovulation. The woman typically checks for ovulation by watching changes in her temperature and in the consistency of cervical mucus. Post-ovulation method In this method, a couple waits to have sex until after ovulation. Summary  Contraception, also called birth control, means methods or devices that prevent pregnancy.  Hormonal methods of contraception include implants, injections, pills, patches, vaginal rings, and emergency contraceptives.  Barrier methods of contraception can include female condoms, female condoms, diaphragms, cervical caps, sponges, and spermicides.  There  are two types of IUDs (intrauterine devices). An IUD can be put in a woman's uterus to prevent pregnancy for 3-5 years.  Permanent sterilization can be done through a procedure for males, females, or both.  Natural family planning methods involve not having sex on days when the woman could become pregnant. This information is not intended to replace advice given to you by your health care provider. Make sure you discuss any questions you have with your health care provider. Document Revised: 09/05/2017 Document Reviewed: 10/06/2016 Elsevier Patient Education  2020 [employer].        Second Trimester of Pregnancy The second trimester is from week 14 through week 27 (months 4 through 6). The second trimester is often a time when you feel your best. Your body has adjusted to being pregnant, and you begin to feel better physically. Usually, morning sickness has lessened or quit completely, you may have  more energy, and you may have an increase in appetite. The second trimester is also a time when the fetus is growing rapidly. At the end of the sixth month, the fetus is about 9 inches long and weighs about 1 pounds. You will likely begin to feel the baby move (quickening) between 16 and 20 weeks of pregnancy. Body changes during your second trimester Your body continues to go through many changes during your second trimester. The changes vary from woman to woman.  Your weight will continue to increase. You will notice your lower abdomen bulging out.  You may begin to get stretch marks on your hips, abdomen, and breasts.  You may develop headaches that can be relieved by medicines. The medicines should be approved by your health care provider.  You may urinate more often because the fetus is pressing on your bladder.  You may develop or continue to have heartburn as a result of your pregnancy.  You may develop constipation because certain hormones are causing the muscles that push waste  through your intestines to slow down.  You may develop hemorrhoids or swollen, bulging veins (varicose veins).  You may have back pain. This is caused by: ? Weight gain. ? Pregnancy hormones that are relaxing the joints in your pelvis. ? A shift in weight and the muscles that support your balance.  Your breasts will continue to grow and they will continue to become tender.  Your gums may bleed and may be sensitive to brushing and flossing.  Dark spots or blotches (chloasma, mask of pregnancy) may develop on your face. This will likely fade after the baby is born.  A dark line from your belly button to the pubic area (linea nigra) may appear. This will likely fade after the baby is born.  You may have changes in your hair. These can include thickening of your hair, rapid growth, and changes in texture. Some women also have hair loss during or after pregnancy, or hair that feels dry or thin. Your hair will most likely return to normal after your baby is born. What to expect at prenatal visits During a routine prenatal visit:  You will be weighed to make sure you and the fetus are growing normally.  Your blood pressure will be taken.  Your abdomen will be measured to track your baby's growth.  The fetal heartbeat will be listened to.  Any test results from the previous visit will be discussed. Your health care provider may ask you:  How you are feeling.  If you are feeling the baby move.  If you have had any abnormal symptoms, such as leaking fluid, bleeding, severe headaches, or abdominal cramping.  If you are using any tobacco products, including cigarettes, chewing tobacco, and electronic cigarettes.  If you have any questions. Other tests that may be performed during your second trimester include:  Blood tests that check for: ? Low iron levels (anemia). ? High blood sugar that affects pregnant women (gestational diabetes) between 47 and 28 weeks. ? Rh antibodies. This  is to check for a protein on red blood cells (Rh factor).  Urine tests to check for infections, diabetes, or protein in the urine.  An ultrasound to confirm the proper growth and development of the baby.  An amniocentesis to check for possible genetic problems.  Fetal screens for spina bifida and Down syndrome.  HIV (human immunodeficiency virus) testing. Routine prenatal testing includes screening for HIV, unless you choose not to have this test. Follow  these instructions at home: Medicines  Follow your health care provider's instructions regarding medicine use. Specific medicines may be either safe or unsafe to take during pregnancy.  Take a prenatal vitamin that contains at least 600 micrograms (mcg) of folic acid.  If you develop constipation, try taking a stool softener if your health care provider approves. Eating and drinking   Eat a balanced diet that includes fresh fruits and vegetables, whole grains, good sources of protein such as meat, eggs, or tofu, and low-fat dairy. Your health care provider will help you determine the amount of weight gain that is right for you.  Avoid raw meat and uncooked cheese. These carry germs that can cause birth defects in the baby.  If you have low calcium intake from food, talk to your health care provider about whether you should take a daily calcium supplement.  Limit foods that are high in fat and processed sugars, such as fried and sweet foods.  To prevent constipation: ? Drink enough fluid to keep your urine clear or pale yellow. ? Eat foods that are high in fiber, such as fresh fruits and vegetables, whole grains, and beans. Activity  Exercise only as directed by your health care provider. Most women can continue their usual exercise routine during pregnancy. Try to exercise for 30 minutes at least 5 days a week. Stop exercising if you experience uterine contractions.  Avoid heavy lifting, wear low heel shoes, and practice good  posture.  A sexual relationship may be continued unless your health care provider directs you otherwise. Relieving pain and discomfort  Wear a good support bra to prevent discomfort from breast tenderness.  Take warm sitz baths to soothe any pain or discomfort caused by hemorrhoids. Use hemorrhoid cream if your health care provider approves.  Rest with your legs elevated if you have leg cramps or low back pain.  If you develop varicose veins, wear support hose. Elevate your feet for 15 minutes, 3-4 times a day. Limit salt in your diet. Prenatal Care  Write down your questions. Take them to your prenatal visits.  Keep all your prenatal visits as told by your health care provider. This is important. Safety  Wear your seat belt at all times when driving.  Make a list of emergency phone numbers, including numbers for family, friends, the hospital, and police and fire departments. General instructions  Ask your health care provider for a referral to a local prenatal education class. Begin classes no later than the beginning of month 6 of your pregnancy.  Ask for help if you have counseling or nutritional needs during pregnancy. Your health care provider can offer advice or refer you to specialists for help with various needs.  Do not use hot tubs, steam rooms, or saunas.  Do not douche or use tampons or scented sanitary pads.  Do not cross your legs for long periods of time.  Avoid cat litter boxes and soil used by cats. These carry germs that can cause birth defects in the baby and possibly loss of the fetus by miscarriage or stillbirth.  Avoid all smoking, herbs, alcohol, and unprescribed drugs. Chemicals in these products can affect the formation and growth of the baby.  Do not use any products that contain nicotine or tobacco, such as cigarettes and e-cigarettes. If you need help quitting, ask your health care provider.  Visit your dentist if you have not gone yet during your  pregnancy. Use a soft toothbrush to brush your teeth and be  gentle when you floss. Contact a health care provider if:  You have dizziness.  You have mild pelvic cramps, pelvic pressure, or nagging pain in the abdominal area.  You have persistent nausea, vomiting, or diarrhea.  You have a bad smelling vaginal discharge.  You have pain when you urinate. Get help right away if:  You have a fever.  You are leaking fluid from your vagina.  You have spotting or bleeding from your vagina.  You have severe abdominal cramping or pain.  You have rapid weight gain or weight loss.  You have shortness of breath with chest pain.  You notice sudden or extreme swelling of your face, hands, ankles, feet, or legs.  You have not felt your baby move in over an hour.  You have severe headaches that do not go away when you take medicine.  You have vision changes. Summary  The second trimester is from week 14 through week 27 (months 4 through 6). It is also a time when the fetus is growing rapidly.  Your body goes through many changes during pregnancy. The changes vary from woman to woman.  Avoid all smoking, herbs, alcohol, and unprescribed drugs. These chemicals affect the formation and growth your baby.  Do not use any tobacco products, such as cigarettes, chewing tobacco, and e-cigarettes. If you need help quitting, ask your health care provider.  Contact your health care provider if you have any questions. Keep all prenatal visits as told by your health care provider. This is important. This information is not intended to replace advice given to you by your health care provider. Make sure you discuss any questions you have with your health care provider. Document Revised: 12/26/2018 Document Reviewed: 10/09/2016 Elsevier Patient Education  2020 [employer] Inc.        Round Ligament Pain  The round ligament is a cord of muscle and tissue that helps support the uterus. It can  become a source of pain during pregnancy if it becomes stretched or twisted as the baby grows. The pain usually begins in the second trimester (13-28 weeks) of pregnancy, and it can come and go until the baby is delivered. It is not a serious problem, and it does not cause harm to the baby. Round ligament pain is usually a short, sharp, and pinching pain, but it can also be a dull, lingering, and aching pain. The pain is felt in the lower side of the abdomen or in the groin. It usually starts deep in the groin and moves up to the outside of the hip area. The pain may occur when you:  Suddenly change position, such as quickly going from a sitting to standing position.  Roll over in bed.  Cough or sneeze.  Do physical activity. Follow these instructions at home:   Watch your condition for any changes.  When the pain starts, relax. Then try any of these methods to help with the pain: ? Sitting down. ? Flexing your knees up to your abdomen. ? Lying on your side with one pillow under your abdomen and another pillow between your legs. ? Sitting in a warm bath for 15-20 minutes or until the pain goes away.  Take over-the-counter and prescription medicines only as told by your health care provider.  Move slowly when you sit down or stand up.  Avoid long walks if they cause pain.  Stop or reduce your physical activities if they cause pain.  Keep all follow-up visits as told by your health  care provider. This is important. Contact a health care provider if:  Your pain does not go away with treatment.  You feel pain in your back that you did not have before.  Your medicine is not helping. Get help right away if:  You have a fever or chills.  You develop uterine contractions.  You have vaginal bleeding.  You have nausea or vomiting.  You have diarrhea.  You have pain when you urinate. Summary  Round ligament pain is felt in the lower abdomen or groin. It is usually a short,  sharp, and pinching pain. It can also be a dull, lingering, and aching pain.  This pain usually begins in the second trimester (13-28 weeks). It occurs because the uterus is stretching with the growing baby, and it is not harmful to the baby.  You may notice the pain when you suddenly change position, when you cough or sneeze, or during physical activity.  Relaxing, flexing your knees to your abdomen, lying on one side, or taking a warm bath may help to get rid of the pain.  Get help from your health care provider if the pain does not go away or if you have vaginal bleeding, nausea, vomiting, diarrhea, or painful urination. This information is not intended to replace advice given to you by your health care provider. Make sure you discuss any questions you have with your health care provider. Document Revised: 02/19/2018 Document Reviewed: 02/19/2018 Elsevier Patient Education  2020 [employer]       AREA PEDIATRIC/FAMILY PRACTICE PHYSICIANS  [facility] [address] [place], [place] [address] Phone - [phone]   Fax - [phone]  [facility] [address] [place], [place]  [address] Phone - [phone]   Fax - [phone]  [facility] [address] [place], [place]  [address] Phone - [phone]   Fax - [phone]  [facility] [address] [place], [place]  [address] Phone - [phone]   Fax - [phone]  [facility] [address] [place], [place]  [address] Phone - [phone]   Fax - [phone]  [facility] [address] [place], [place]  [address] Phone - [phone]   Fax - [phone]  [facility]  [address] [place], [place]  [address] Phone - [phone]   Fax - [phone]  [facility] AT [facility] [address] [place], [address] [place], [place]  [address] Phone -  [phone]   Fax - [phone]  [facility] AT [facility] [address] [place], [place]  [address] Phone - [phone]   Fax - [phone] [facility] AT [place] [address] [place], [place]  [address] Phone - [phone]   Fax - [phone]  [facility] [address] [place], [place]  [address] Phone - [phone]   Fax - [phone]  [facility] [address] [place], [place]  [address] Phone - [phone]   Fax - [phone]  [facility] [place] [address] [place], [place]  [address] Phone - [phone]   Fax - [phone]  [facility] [address] [place], [place]  [address] Phone - [phone]  [facility] [address] [place], [place]  [address] Phone - [phone]   Fax - [phone]  [facility] DOCTOR [address], [address] [place],  [place]  [address] Phone - [phone]   Fax - [phone]  [facility] [address] [place], [place]  [address] Phone - [phone]   Fax - [phone]  [facility] [address] [place], [place]  [address] Phone - [phone]   Fax - [phone]   [facility] [address], [address] [place], [place]  [address] Phone - [phone]   Fax - [phone]  [facility] PEDIATRICS [address] [place], [place]  [address] Phone - [phone]   Fax - [phone]  DAVID RUBIN [address] [place], [place]  [address] Phone - [phone]   Fax - [phone]  [facility] [address] [place], [place]  [address] Phone - [phone]   Fax - [phone]  [place] - Alita Chyle [address] [place], [place]  [address] Phone - [phone]   Fax - [phone] Gerarda Fraction [address] [place], [place]  [address] Phone -  [phone]   Fax - [phone]  [facility] CREEK [address] [place], [place]  [address] Phone - [phone]   Fax - [phone]  [facility] MEDICINE - [place] [address], [address] [place], [place]  [address] Phone - [phone]   Fax - [phone]         Childbirth Education Options: [facility] Department Classes:  Childbirth education classes can help you get ready for a positive parenting experience. You can also meet other expectant parents and get free stuff for your baby. Each class runs for five weeks on the same night and costs $45 for the mother-to-be and her support person. Medicaid covers the cost if you are eligible. Call 7022449532 to register. [facility] Childbirth Education:  6615047601 or [phone] or [email or web address]  Baby & Me Class: Discuss newborn & infant parenting and family adjustment issues with other new mothers in a relaxed environment. Each week brings a new speaker or baby-centered activity. We encourage new mothers to join [place] every Thursday at 11:00am. Babies birth until crawling. No registration or fee. Daddy [employer]: This course offers Dads-to-be the tools and knowledge needed to feel confident on their journey to becoming new fathers. Experienced dads, who have been trained as coaches, teach dads-to-be how to hold, comfort, diaper, swaddle and play with their infant while being able to support the new mom as well. A class for men taught by men. $25/dad Big Brother/Big Sister: Let your children share in the joy of a new brother or sister in this special class designed just for them. Class includes discussion about how families care for babies: swaddling, holding, diapering, safety as well as how they can be helpful in their new role. This class is designed for children ages 2 to 38, but any age is welcome. Please register each child individually. $5/child  Mom Talk: This mom-led group  offers support and connection to mothers as they journey through the adjustments and struggles of that sometimes overwhelming first year after the birth of a child. Tuesdays at 10:00am and Thursdays at 6:00pm. Babies welcome. No registration or fee. Breastfeeding Support Group: This group is a mother-to-mother support circle where moms have the opportunity to share their breastfeeding experiences. A Lactation Consultant is present for questions and concerns. Meets each Tuesday at 11:00am. No fee or registration. Breastfeeding Your Baby: Learn what to expect in the first days of breastfeeding your newborn.  This class will help you feel  more confident with the skills needed to begin your breastfeeding experience. Many new mothers are concerned about breastfeeding after leaving the hospital. This class will also address the most common fears and challenges about breastfeeding during the first few weeks, months and beyond. (call for fee) Comfort Techniques and Tour: This 2 hour interactive class will provide you the opportunity to learn & practice hands-on techniques that can help relieve some of the discomfort of labor and encourage your baby to rotate toward the best position for birth. You and your partner will be able to try a variety of labor positions with birth balls and rebozos as well as practice breathing, relaxation, and visualization techniques. A tour of the [facility] is included with this class. $20 per registrant and support person Childbirth Class- Weekend Option: This class is a Weekend version of our Birth & Baby series. It is designed for parents who have a difficult time fitting several weeks of classes into their schedule. It covers the care of your newborn and the basics of labor and childbirth. It also includes a Maternity Care Center Tour of [facility] and lunch. The class is held two consecutive days: beginning on Friday evening from 6:30 - 8:30 p.m. and  the next day, Saturday from 9 a.m. - 4 p.m. (call for fee) Linden Dolin Class: Interested in a waterbirth?  This informational class will help you discover whether waterbirth is the right fit for you. Education about waterbirth itself, supplies you would need and how to assemble your support team is what you can expect from this class. Some obstetrical practices require this class in order to pursue a waterbirth. (Not all obstetrical practices offer waterbirth-check with your healthcare provider.) Register only the expectant mom, but you are encouraged to bring your partner to class! Required if planning waterbirth, no fee. Infant/Child CPR: Parents, grandparents, babysitters, and friends learn Cardio-Pulmonary Resuscitation skills for infants and children. You will also learn how to treat both conscious and unconscious choking in infants and children. This Family & Friends program does not offer certification. Register each participant individually to ensure that enough mannequins are available. (Call for fee) Grandparent Love: Expecting a grandbaby? This class is for you! Learn about the latest infant care and safety recommendations and ways to support your own child as he or she transitions into the parenting role. Taught by Registered Nurses who are childbirth instructors, but most importantly...they are grandmothers too! $10/person. Childbirth Class- Natural Childbirth: This series of 5 weekly classes is for expectant parents who want to learn and practice natural methods of coping with the process of labor and childbirth. Relaxation, breathing, massage, visualization, role of the partner, and helpful positioning are highlighted. Participants learn how to be confident in their body's ability to give birth. This class will empower and help parents make informed decisions about their own care. Includes discussion that will help new parents transition into the immediate postpartum period. Maternity Care Center  Tour of [facility] is included. We suggest taking this class between 25-32 weeks, but it's only a recommendation. $75 per registrant and one support person or $30 Medicaid. Childbirth Class- 3 week Series: This option of 3 weekly classes helps you and your labor partner prepare for childbirth. Newborn care, labor & birth, cesarean birth, pain management, and comfort techniques are discussed and a Maternity Care Center Tour of [facility] is included. The class meets at the same time, on the same day of the week for 3 consecutive weeks  beginning with the starting date you choose. $60 for registrant and one support person.  Marvelous Multiples: Expecting twins, triplets, or more? This class covers the differences in labor, birth, parenting, and breastfeeding issues that face multiples' parents. NICU tour is included. Led by a Certified Childbirth Educator who is the mother of twins. No fee. Caring for Baby: This class is for expectant and adoptive parents who want to learn and practice the most up-to-date newborn care for their babies. Focus is on birth through the first six weeks of life. Topics include feeding, bathing, diapering, crying, umbilical cord care, circumcision care and safe sleep. Parents learn to recognize symptoms of illness and when to call the pediatrician. Register only the mom-to-be and your partner or support person can plan to come with you! $10 per registrant and support person Childbirth Class- online option: This online class offers you the freedom to complete a Birth and Baby series in the comfort of your own home. The flexibility of this option allows you to review sections at your own pace, at times convenient to you and your support people. It includes additional video information, animations, quizzes, and extended activities. Get organized with helpful eClass tools, checklists, and trackers. Once you register online for the class, you will receive an email within a few days  to accept the invitation and begin the class when the time is right for you. The content will be available to you for 60 days. $60 for 60 days of online access for you and your support people.

## 2020-09-05 NOTE — Progress Notes (Signed)
Subjective:  Jasmin Herman is a 19 y.o. G1P0 at [redacted]w[redacted]d being seen today for ongoing prenatal care.  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  Patient reports no complaints.  Contractions: Not present. Vag. Bleeding: None.   . Denies leaking of fluid.   The following portions of the patient's history were reviewed and updated as appropriate: allergies, current medications, past family history, past medical history, past social history, past surgical history and problem list. Problem list updated.  Objective:   Vitals:   09/05/20 1504  BP: 116/88  Pulse: (!) 105  Weight: 122 lb 3.2 oz (55.4 kg)    Fetal Status: Fetal Heart Rate (bpm): 154 Fundal Height: 17 cm       General:  Alert, oriented and cooperative. Patient is in no acute distress.  Skin: Skin is warm and dry. No rash noted.   Cardiovascular: Normal heart rate noted  Respiratory: Normal respiratory effort, no problems with respiration noted  Abdomen: Soft, gravid, appropriate for gestational age. Pain/Pressure: Absent     Pelvic: Vag. Bleeding: None     Cervical exam deferred        Extremities: Normal range of motion.  Edema: None  Mental Status: Normal mood and affect. Normal behavior. Normal judgment and thought content.   Urinalysis:      Assessment and Plan:  Pregnancy: G1P0 at [redacted]w[redacted]d  1. Supervision of normal first pregnancy, antepartum -AFP -anatomy scan scheduled 09/19/2020 -discussed contraception, pt unsure, information given -peds list given -childbirth class info given  2. Alpha thalassemia silent carrier -declines genetic counseling  3. 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 I discussed the assessment and treatment plan with the patient. The patient was provided an opportunity to ask questions and all were answered. The patient agreed with the plan and demonstrated an understanding of the instructions. The patient was advised to call back or seek an in-person office evaluation/go to MAU at [facility] for any urgent or concerning symptoms. Please refer to After Visit Summary for other counseling recommendations.  Return in about 4 weeks (around 10/03/2020) for in-person LOB/APP OK.   Rick Carruthers, Odie Sera, NP

## 2020-09-05 NOTE — Progress Notes (Signed)
Pt states that she is not feeling fetal movement yet, denies pain. 

## 2020-09-07 LAB — AFP, SERUM, OPEN SPINA BIFIDA
AFP MoM: 0.54
AFP Value: 23.8 ng/mL
Gest. Age on Collection Date: 17 weeks
Maternal Age At EDD: 21.8 yr
OSBR Risk 1 IN: 10000
Test Results:: NEGATIVE
Weight: 122 [lb_av]

## 2020-09-12 ENCOUNTER — Other Ambulatory Visit: Payer: Self-pay

## 2020-09-12 DIAGNOSIS — Z3400 Encounter for supervision of normal first pregnancy, unspecified trimester: Secondary | ICD-10-CM

## 2020-09-12 MED ORDER — PNV-SELECT 27-0.6-0.4 MG PO TABS: 1.0000 | ORAL_TABLET | Freq: Every day | ORAL | 11 refills | Status: DC

## 2020-09-17 NOTE — L&D Delivery Note (Signed)
Delivery Note Called to bedside and patient complete and pushing. At 10:05 PM a viable female was delivered via Vaginal, Spontaneous (Presentation: Middle Occiput Anterior).  No nuchal cord noted. APGAR: 9, 9; weight pending.   Placenta status: Spontaneous;Pathology, Intact.  Cord: 3 vessels with the following complications: marginal cord insert, placenta sent to pathology.  Anesthesia: Epidural Episiotomy: None Lacerations: Left Sulcus; Right Labial Suture Repair: 3.0 vicryl rapide Est. Blood Loss (mL): 78  Mom to postpartum.  Baby to Couplet care / Skin to Skin.  Alric Seton 01/29/2021, 10:32 PM

## 2020-09-19 ENCOUNTER — Ambulatory Visit: Payer: BC Managed Care – PPO | Attending: Advanced Practice Midwife

## 2020-10-03 ENCOUNTER — Encounter: Payer: BC Managed Care – PPO | Admitting: Advanced Practice Midwife

## 2020-10-04 ENCOUNTER — Telehealth: Payer: BC Managed Care – PPO | Admitting: Obstetrics

## 2020-10-17 ENCOUNTER — Encounter: Payer: Self-pay | Admitting: Obstetrics

## 2020-10-17 ENCOUNTER — Telehealth (INDEPENDENT_AMBULATORY_CARE_PROVIDER_SITE_OTHER): Payer: BC Managed Care – PPO | Admitting: Obstetrics

## 2020-10-17 VITALS — BP 129/87 | HR 97

## 2020-10-17 DIAGNOSIS — O99891 Other specified diseases and conditions complicating pregnancy: Secondary | ICD-10-CM

## 2020-10-17 DIAGNOSIS — Z148 Genetic carrier of other disease: Secondary | ICD-10-CM

## 2020-10-17 DIAGNOSIS — Z3A23 23 weeks gestation of pregnancy: Secondary | ICD-10-CM

## 2020-10-17 DIAGNOSIS — R12 Heartburn: Secondary | ICD-10-CM

## 2020-10-17 DIAGNOSIS — Z3400 Encounter for supervision of normal first pregnancy, unspecified trimester: Secondary | ICD-10-CM

## 2020-10-17 NOTE — Progress Notes (Signed)
   OBSTETRICS PRENATAL VIRTUAL VISIT ENCOUNTER NOTE  Provider location: Center for [facility]   I connected with Jasmin Herman on 10/17/20 at  3:15 PM EST by MyChart Video Encounter at home and verified that I am speaking with the correct person using two identifiers.   I discussed the limitations, risks, security and privacy concerns of performing an evaluation and management service virtually and the availability of in person appointments. I also discussed with the patient that there may be a patient responsible charge related to this service. The patient expressed understanding and agreed to proceed. Subjective:  Jasmin Herman is a 20 y.o. G1P0 at [redacted]w[redacted]d being seen today for ongoing prenatal care.  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  Patient reports heartburn.  Contractions: Not present. Vag. Bleeding: None.   . Denies any leaking of fluid.   The following portions of the patient's history were reviewed and updated as appropriate: allergies, current medications, past family history, past medical history, past social history, past surgical history and problem list.   Objective:   Vitals:   10/17/20 1505  BP: 129/87  Pulse: 97    Fetal Status:           General:  Alert, oriented and cooperative. Patient is in no acute distress.  Respiratory: Normal respiratory effort, no problems with respiration noted  Mental Status: Normal mood and affect. Normal behavior. Normal judgment and thought content.  Rest of physical exam deferred due to type of encounter  Imaging: No results found.  Assessment and Plan:  Pregnancy: G1P0 at [redacted]w[redacted]d  1. Supervision of normal first pregnancy, antepartum   Preterm labor symptoms and general obstetric precautions including but not limited to vaginal  bleeding, contractions, leaking of fluid and fetal movement were reviewed in detail with the patient. I discussed the assessment and treatment plan with the patient. The patient was provided an opportunity to ask questions and all were answered. The patient agreed with the plan and demonstrated an understanding of the instructions. The patient was advised to call back or seek an in-person office evaluation/go to [facility]U at [facility] for any urgent or concerning symptoms. Please refer to After Visit Summary for other counseling recommendations.   I provided 15 minutes of face-to-face time during this encounter.  Return in about 4 weeks (around 11/14/2020) for ROB, 2 hour OGTT.   Coral Ceo, MD Center for [facility], [facility] 10/17/20

## 2020-10-17 NOTE — Progress Notes (Signed)
I connected with Jasmin Herman on 10/17/20 at  3:15 PM EST by telephone and verified that I am speaking with the correct person using two identifiers.  No complaints today per pt

## 2020-11-14 ENCOUNTER — Other Ambulatory Visit: Payer: Self-pay

## 2020-11-14 ENCOUNTER — Other Ambulatory Visit: Payer: BC Managed Care – PPO

## 2020-11-14 ENCOUNTER — Ambulatory Visit (INDEPENDENT_AMBULATORY_CARE_PROVIDER_SITE_OTHER): Payer: BC Managed Care – PPO | Admitting: Certified Nurse Midwife

## 2020-11-14 ENCOUNTER — Encounter: Payer: Self-pay | Admitting: Certified Nurse Midwife

## 2020-11-14 VITALS — BP 109/66 | HR 110 | Wt 132.0 lb

## 2020-11-14 DIAGNOSIS — Z3A28 28 weeks gestation of pregnancy: Secondary | ICD-10-CM

## 2020-11-14 DIAGNOSIS — Z3400 Encounter for supervision of normal first pregnancy, unspecified trimester: Secondary | ICD-10-CM

## 2020-11-14 NOTE — Progress Notes (Signed)
   PRENATAL VISIT NOTE  Subjective:  Jasmin Herman is a 20 y.o. G1P0 at [redacted]w[redacted]d being seen today for ongoing prenatal care.  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  Patient reports no complaints.  Contractions: Not present. Vag. Bleeding: None.  Movement: Present. Denies leaking of fluid.   The following portions of the patient's history were reviewed and updated as appropriate: allergies, current medications, past family history, past medical history, past social history, past surgical history and problem list.   Objective:   Vitals:   11/14/20 0939  BP: 109/66  Pulse: (!) 110  Weight: 132 lb (59.9 kg)    Fetal Status: Fetal Heart Rate (bpm): 145 Fundal Height: 25 cm Movement: Present     General:  Alert, oriented and cooperative. Patient is in no acute distress.  Skin: Skin is warm and dry. No rash noted.   Cardiovascular: Normal heart rate noted  Respiratory: Normal respiratory effort, no problems with respiration noted  Abdomen: Soft, gravid, appropriate for gestational age.  Pain/Pressure: Absent     Pelvic: Cervical exam deferred        Extremities: Normal range of motion.  Edema: None  Mental Status: Normal mood and affect. Normal behavior. Normal judgment and thought content.   Assessment and Plan:  Pregnancy: G1P0 at [redacted]w[redacted]d 1. Supervision of normal first pregnancy, antepartum - Patient doing well, no complaints or concerns at this time  - routine prenatal care - anticipatory guidance on upcoming appointments with next being virtual  - Discussed with patient appointment schedule for duration of pregnancy  - Educated and discussed GTT completed today, discussed what abnormal results mean and diagnoses of GDM if abnormal   2. [redacted] weeks gestation of pregnancy - Glucose Tolerance, 2 Hours w/1  Hour - CBC - RPR - HIV Antibody (routine testing w rflx)  Preterm labor symptoms and general obstetric precautions including but not limited to vaginal bleeding, contractions, leaking of fluid and fetal movement were reviewed in detail with the patient. Please refer to After Visit Summary for other counseling recommendations.   Return in about 2 weeks (around 11/28/2020) for LROB, virtual.  Future Appointments  Date Time Provider Department Center  11/14/2020 10:35 AM Sharyon Cable, CNM [facility] None  11/28/2020  2:00 PM Leftwich-Kirby, Wilmer Floor, CNM [facility] None    Sharyon Cable, CNM

## 2020-11-14 NOTE — Progress Notes (Signed)
+   Fetal movement. No complaints. Pt would like to defer TDap today. VIS given for information.

## 2020-11-14 NOTE — Patient Instructions (Signed)
Oral Glucose Tolerance Test During Pregnancy Why am I having this test? The oral glucose tolerance test (OGTT) is done to check how your body processes blood sugar (glucose). This is one of several tests used to diagnose diabetes that develops during pregnancy (gestational diabetes mellitus). Gestational diabetes is a short-term form of diabetes that some women develop while they are pregnant. It usually occurs during the second trimester of pregnancy and goes away after delivery. Testing, or screening, for gestational diabetes usually occurs at weeks 24-28 of pregnancy. You may have the OGTT test after having a 1-hour glucose screening test if the results from that test indicate that you may have gestational diabetes. This test may also be needed if:  You have a history of gestational diabetes.  There is a history of giving birth to very large babies or of losing pregnancies (having stillbirths).  You have signs and symptoms of diabetes, such as: ? Changes in your eyesight. ? Tingling or numbness in your hands or feet. ? Changes in hunger, thirst, and urination, and these are not explained by your pregnancy. What is being tested? This test measures the amount of glucose in your blood at different times during a period of 3 hours. This shows how well your body can process glucose. What kind of sample is taken? Blood samples are required for this test. They are usually collected by inserting a needle into a blood vessel.   How do I prepare for this test?  For 3 days before your test, eat normally. Have plenty of carbohydrate-rich foods.  Follow instructions from your health care provider about: ? Eating or drinking restrictions on the day of the test. You may be asked not to eat or drink anything other than water (to fast) starting 8-10 hours before the test. ? Changing or stopping your regular medicines. Some medicines may interfere with this test. Tell a health care provider about:  All  medicines you are taking, including vitamins, herbs, eye drops, creams, and over-the-counter medicines.  Any blood disorders you have.  Any surgeries you have had.  Any medical conditions you have. What happens during the test? First, your blood glucose will be measured. This is referred to as your fasting blood glucose because you fasted before the test. Then, you will drink a glucose solution that contains a certain amount of glucose. Your blood glucose will be measured again 1, 2, and 3 hours after you drink the solution. This test takes about 3 hours to complete. You will need to stay at the testing location during this time. During the testing period:  Do not eat or drink anything other than the glucose solution.  Do not exercise.  Do not use any products that contain nicotine or tobacco, such as cigarettes, e-cigarettes, and chewing tobacco. These can affect your test results. If you need help quitting, ask your health care provider. The testing procedure may vary among health care providers and hospitals. How are the results reported? Your results will be reported as milligrams of glucose per deciliter of blood (mg/dL) or millimoles per liter (mmol/L). There is more than one source for screening and diagnosis reference values used to diagnose gestational diabetes. Your health care provider will compare your results to normal values that were established after testing a large group of people (reference values). Reference values may vary among labs and hospitals. For this test (Carpenter-Coustan), reference values are:  Fasting: 95 mg/dL (5.3 mmol/L).  1 hour: 180 mg/dL (10.0 mmol/L).  2 hour:   155 mg/dL (8.6 mmol/L).  3 hour: 140 mg/dL (7.8 mmol/L). What do the results mean? Results below the reference values are considered normal. If two or more of your blood glucose levels are at or above the reference values, you may be diagnosed with gestational diabetes. If only one level is  high, your health care provider may suggest repeat testing or other tests to confirm a diagnosis. Talk with your health care provider about what your results mean. Questions to ask your health care provider Ask your health care provider, or the department that is doing the test:  When will my results be ready?  How will I get my results?  What are my treatment options?  What other tests do I need?  What are my next steps? Summary  The oral glucose tolerance test (OGTT) is one of several tests used to diagnose diabetes that develops during pregnancy (gestational diabetes mellitus). Gestational diabetes is a short-term form of diabetes that some women develop while they are pregnant.  You may have the OGTT test after having a 1-hour glucose screening test if the results from that test show that you may have gestational diabetes. You may also have this test if you have any symptoms or risk factors for this type of diabetes.  Talk with your health care provider about what your results mean. This information is not intended to replace advice given to you by your health care provider. Make sure you discuss any questions you have with your health care provider. Document Revised: 02/11/2020 Document Reviewed: 02/11/2020 Elsevier Patient Education  2021 Elsevier Inc.  

## 2020-11-15 LAB — CBC
Hematocrit: 34.4 % (ref 34.0–46.6)
Hemoglobin: 11.2 g/dL (ref 11.1–15.9)
MCH: 26.3 pg — ABNORMAL LOW (ref 26.6–33.0)
MCHC: 32.6 g/dL (ref 31.5–35.7)
MCV: 81 fL (ref 79–97)
Platelets: 284 10*3/uL (ref 150–450)
RBC: 4.26 x10E6/uL (ref 3.77–5.28)
RDW: 12.9 % (ref 11.7–15.4)
WBC: 13.5 10*3/uL — ABNORMAL HIGH (ref 3.4–10.8)

## 2020-11-15 LAB — GLUCOSE TOLERANCE, 2 HOURS W/ 1HR
Glucose, 1 hour: 127 mg/dL (ref 65–179)
Glucose, 2 hour: 94 mg/dL (ref 65–152)
Glucose, Fasting: 77 mg/dL (ref 65–91)

## 2020-11-15 LAB — RPR: RPR Ser Ql: NONREACTIVE

## 2020-11-15 LAB — HIV ANTIBODY (ROUTINE TESTING W REFLEX): HIV Screen 4th Generation wRfx: NONREACTIVE

## 2020-11-28 ENCOUNTER — Telehealth (INDEPENDENT_AMBULATORY_CARE_PROVIDER_SITE_OTHER): Payer: BC Managed Care – PPO | Admitting: Advanced Practice Midwife

## 2020-11-28 ENCOUNTER — Encounter: Payer: Self-pay | Admitting: Advanced Practice Midwife

## 2020-11-28 VITALS — BP 123/84 | HR 109

## 2020-11-28 DIAGNOSIS — Z3400 Encounter for supervision of normal first pregnancy, unspecified trimester: Secondary | ICD-10-CM

## 2020-11-28 DIAGNOSIS — Z148 Genetic carrier of other disease: Secondary | ICD-10-CM

## 2020-11-28 DIAGNOSIS — Z3403 Encounter for supervision of normal first pregnancy, third trimester: Secondary | ICD-10-CM

## 2020-11-28 DIAGNOSIS — Z3A29 29 weeks gestation of pregnancy: Secondary | ICD-10-CM

## 2020-11-28 NOTE — Progress Notes (Signed)
142/86   123/84 P 109

## 2020-11-28 NOTE — Progress Notes (Signed)
   OBSTETRICS PRENATAL VIRTUAL VISIT ENCOUNTER NOTE  Provider location: Center for [facility]   I connected with Jasmin Herman on 11/28/20 at  2:00 PM EDT by MyChart Video Encounter at home and verified that I am speaking with the correct person using two identifiers.   I discussed the limitations, risks, security and privacy concerns of performing an evaluation and management service virtually and the availability of in person appointments. I also discussed with the patient that there may be a patient responsible charge related to this service. The patient expressed understanding and agreed to proceed. Subjective:  Jasmin Herman is a 20 y.o. G1P0 at [redacted]w[redacted]d being seen today for ongoing prenatal care.  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  Patient reports no complaints.  Contractions: Not present. Vag. Bleeding: None.  Movement: Present. Denies any leaking of fluid.   The following portions of the patient's history were reviewed and updated as appropriate: allergies, current medications, past family history, past medical history, past social history, past surgical history and problem list.   Objective:   Vitals:   11/28/20 1406  BP: 123/84  Pulse: (!) 109    Fetal Status:     Movement: Present     General:  Alert, oriented and cooperative. Patient is in no acute distress.  Respiratory: Normal respiratory effort, no problems with respiration noted  Mental Status: Normal mood and affect. Normal behavior. Normal judgment and thought content.  Rest of physical exam deferred due to type of encounter  Imaging: No results found.  Assessment and Plan:  Pregnancy: G1P0 at [redacted]w[redacted]d 1. Supervision of normal first pregnancy, antepartum --Pt reports good fetal movement, denies cramping, LOF, or  vaginal bleeding --Initial BP 140s/80s, retake wnl --Reviewed normal 28 week labs including GTT --Anticipatory guidance about next visits/weeks of pregnancy given. --Next visit in 2 weeks in office  2. [redacted] weeks gestation of pregnancy   Preterm labor symptoms and general obstetric precautions including but not limited to vaginal bleeding, contractions, leaking of fluid and fetal movement were reviewed in detail with the patient. I discussed the assessment and treatment plan with the patient. The patient was provided an opportunity to ask questions and all were answered. The patient agreed with the plan and demonstrated an understanding of the instructions. The patient was advised to call back or seek an in-person office evaluation/go to MAU at [facility] for any urgent or concerning symptoms. Please refer to After Visit Summary for other counseling recommendations.   I provided 7 minutes of face-to-face time during this encounter.  No follow-ups on file.  No future appointments.  Sharen Counter, CNM Center for [employer], [facility]

## 2020-12-12 ENCOUNTER — Ambulatory Visit (INDEPENDENT_AMBULATORY_CARE_PROVIDER_SITE_OTHER): Payer: BC Managed Care – PPO | Admitting: Advanced Practice Midwife

## 2020-12-12 ENCOUNTER — Encounter: Payer: Self-pay | Admitting: Advanced Practice Midwife

## 2020-12-12 ENCOUNTER — Other Ambulatory Visit: Payer: Self-pay

## 2020-12-12 VITALS — BP 117/72 | HR 108 | Wt 134.0 lb

## 2020-12-12 DIAGNOSIS — Z3400 Encounter for supervision of normal first pregnancy, unspecified trimester: Secondary | ICD-10-CM

## 2020-12-12 DIAGNOSIS — K529 Noninfective gastroenteritis and colitis, unspecified: Secondary | ICD-10-CM

## 2020-12-12 NOTE — Progress Notes (Signed)
ROB 31w  CC: a few days ago last Friday/Satpt experienced possible stomach bug had nausea /vomiting feeling better today.

## 2020-12-12 NOTE — Patient Instructions (Signed)

## 2020-12-12 NOTE — Progress Notes (Signed)
   PRENATAL VISIT NOTE  Subjective:  Jasmin Herman is a 20 y.o. G1P0 at [redacted]w[redacted]d being seen today for ongoing prenatal care.  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  Patient reports no complaints.  Contractions: Not present. Vag. Bleeding: None.  Movement: Present. Denies leaking of fluid.   The following portions of the patient's history were reviewed and updated as appropriate: allergies, current medications, past family history, past medical history, past social history, past surgical history and problem list.   Objective:   Vitals:   12/12/20 1343  BP: 117/72  Pulse: (!) 108  Weight: 134 lb (60.8 kg)    Fetal Status: Fetal Heart Rate (bpm): 154 Fundal Height: 30 cm Movement: Present     General:  Alert, oriented and cooperative. Patient is in no acute distress.  Skin: Skin is warm and dry. No rash noted.   Cardiovascular: Normal heart rate noted  Respiratory: Normal respiratory effort, no problems with respiration noted  Abdomen: Soft, gravid, appropriate for gestational age.  Pain/Pressure: Absent     Pelvic: Cervical exam deferred        Extremities: Normal range of motion.  Edema: None  Mental Status: Normal mood and affect. Normal behavior. Normal judgment and thought content.   Assessment and Plan:  Pregnancy: G1P0 at [redacted]w[redacted]d 1. Supervision of normal first pregnancy, antepartum --Anticipatory guidance about next visits/weeks of pregnancy given. --Next visit in 2 weeks, virtual  2. Gastroenteritis, acute --2-3 days ago, nausea/vomiting. No sick contacts.  Symptoms resolved now, declines need for antiemetics --Continue to increase PO fluids, notify office or go to MAU if can't keep down food/fluids for 24 hours  Preterm labor symptoms and general obstetric precautions including but not  limited to vaginal bleeding, contractions, leaking of fluid and fetal movement were reviewed in detail with the patient. Please refer to After Visit Summary for other counseling recommendations.   Return in about 2 weeks (around 12/26/2020).  No future appointments.  Sharen Counter, CNM

## 2020-12-26 ENCOUNTER — Other Ambulatory Visit: Payer: Self-pay

## 2020-12-26 ENCOUNTER — Ambulatory Visit (INDEPENDENT_AMBULATORY_CARE_PROVIDER_SITE_OTHER): Payer: BC Managed Care – PPO | Admitting: Advanced Practice Midwife

## 2020-12-26 VITALS — BP 118/68 | HR 94 | Wt 136.0 lb

## 2020-12-26 DIAGNOSIS — Z3400 Encounter for supervision of normal first pregnancy, unspecified trimester: Secondary | ICD-10-CM

## 2020-12-26 DIAGNOSIS — Z3A33 33 weeks gestation of pregnancy: Secondary | ICD-10-CM

## 2020-12-26 DIAGNOSIS — Z23 Encounter for immunization: Secondary | ICD-10-CM

## 2020-12-26 NOTE — Progress Notes (Signed)
   PRENATAL VISIT NOTE  Subjective:  Jasmin Herman is a 20 y.o. G1P0 at [redacted]w[redacted]d being seen today for ongoing prenatal care.  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  Patient reports no complaints.  Contractions: Not present. Vag. Bleeding: None.  Movement: Present. Denies leaking of fluid.   The following portions of the patient's history were reviewed and updated as appropriate: allergies, current medications, past family history, past medical history, past social history, past surgical history and problem list.   Objective:   Vitals:   12/26/20 1514  BP: 118/68  Pulse: 94  Weight: 136 lb (61.7 kg)    Fetal Status: Fetal Heart Rate (bpm): 140   Movement: Present     General:  Alert, oriented and cooperative. Patient is in no acute distress.  Skin: Skin is warm and dry. No rash noted.   Cardiovascular: Normal heart rate noted  Respiratory: Normal respiratory effort, no problems with respiration noted  Abdomen: Soft, gravid, appropriate for gestational age.  Pain/Pressure: Absent     Pelvic: Cervical exam deferred        Extremities: Normal range of motion.  Edema: None  Mental Status: Normal mood and affect. Normal behavior. Normal judgment and thought content.   Assessment and Plan:  Pregnancy: G1P0 at [redacted]w[redacted]d 1. Supervision of normal first pregnancy, antepartum --Anticipatory guidance about next visits/weeks of pregnancy given. --Reviewed labor precautions, where/when to go to hospital --Next visit in 2 weeks  2. [redacted] weeks gestation of pregnancy  Preterm labor symptoms and general obstetric precautions including but not limited to vaginal bleeding, contractions, leaking of fluid and fetal movement were reviewed in detail with the patient. Please refer to After Visit Summary for other counseling  recommendations.   Return in about 2 weeks (around 01/09/2021).  No future appointments.  Sharen Counter, CNM

## 2020-12-26 NOTE — Progress Notes (Signed)
ROB 33wks  CC: None

## 2020-12-26 NOTE — Patient Instructions (Signed)

## 2021-01-10 ENCOUNTER — Ambulatory Visit (INDEPENDENT_AMBULATORY_CARE_PROVIDER_SITE_OTHER): Payer: BC Managed Care – PPO | Admitting: Obstetrics

## 2021-01-10 ENCOUNTER — Other Ambulatory Visit: Payer: Self-pay

## 2021-01-10 ENCOUNTER — Encounter: Payer: Self-pay | Admitting: Obstetrics

## 2021-01-10 DIAGNOSIS — Z3400 Encounter for supervision of normal first pregnancy, unspecified trimester: Secondary | ICD-10-CM

## 2021-01-10 NOTE — Progress Notes (Signed)
Subjective:  Jasmin Herman is a 20 y.o. G1P0 at [redacted]w[redacted]d being seen today for ongoing prenatal care.  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  Patient reports no complaints.  Contractions: Not present. Vag. Bleeding: None.  Movement: Present. Denies leaking of fluid.   The following portions of the patient's history were reviewed and updated as appropriate: allergies, current medications, past family history, past medical history, past social history, past surgical history and problem list. Problem list updated.  Objective:   Vitals:   01/10/21 1435  BP: 113/73  Pulse: (!) 108  Weight: 136 lb (61.7 kg)    Fetal Status:     Movement: Present     General:  Alert, oriented and cooperative. Patient is in no acute distress.  Skin: Skin is warm and dry. No rash noted.   Cardiovascular: Normal heart rate noted  Respiratory: Normal respiratory effort, no problems with respiration noted  Abdomen: Soft, gravid, appropriate for gestational age. Pain/Pressure: Absent     Pelvic:  Cervical exam deferred        Extremities: Normal range of motion.     Mental Status: Normal mood and affect. Normal behavior. Normal judgment and thought content.   Urinalysis:      Assessment and Plan:  Pregnancy: G1P0 at [redacted]w[redacted]d  1. Supervision of normal first pregnancy, antepartum   Preterm labor symptoms and general obstetric precautions including but not limited to vaginal bleeding, contractions, leaking of fluid and fetal movement were reviewed in detail with the patient. Please refer to After Visit Summary for other counseling recommendations.   Return in about 1 week (around 01/17/2021) for ROB.   Brock Bad, MD  01/10/21

## 2021-01-16 ENCOUNTER — Encounter: Payer: Self-pay | Admitting: Obstetrics

## 2021-01-16 ENCOUNTER — Other Ambulatory Visit (HOSPITAL_COMMUNITY)
Admission: RE | Admit: 2021-01-16 | Discharge: 2021-01-16 | Disposition: A | Discharge status: Home / Self Care | Payer: BC Managed Care – PPO | Source: Ambulatory Visit | Attending: Obstetrics | Admitting: Obstetrics

## 2021-01-16 ENCOUNTER — Ambulatory Visit (INDEPENDENT_AMBULATORY_CARE_PROVIDER_SITE_OTHER): Payer: BC Managed Care – PPO | Admitting: Obstetrics

## 2021-01-16 ENCOUNTER — Other Ambulatory Visit: Payer: Self-pay

## 2021-01-16 VITALS — BP 114/74 | HR 103 | Wt 139.0 lb

## 2021-01-16 DIAGNOSIS — Z3400 Encounter for supervision of normal first pregnancy, unspecified trimester: Secondary | ICD-10-CM | POA: Diagnosis not present

## 2021-01-16 NOTE — Progress Notes (Signed)
Subjective:  Jasmin Herman is a 20 y.o. G1P0 at [redacted]w[redacted]d being seen today for ongoing prenatal care.  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  Patient reports backache and heartburn.  Contractions: Not present. Vag. Bleeding: None.  Movement: Present. Denies leaking of fluid.   The following portions of the patient's history were reviewed and updated as appropriate: allergies, current medications, past family history, past medical history, past social history, past surgical history and problem list. Problem list updated.  Objective:   Vitals:   01/16/21 1430  BP: 114/74  Pulse: (!) 103  Weight: 139 lb (63 kg)    Fetal Status:     Movement: Present     General:  Alert, oriented and cooperative. Patient is in no acute distress.  Skin: Skin is warm and dry. No rash noted.   Cardiovascular: Normal heart rate noted  Respiratory: Normal respiratory effort, no problems with respiration noted  Abdomen: Soft, gravid, appropriate for gestational age. Pain/Pressure: Absent     Pelvic:  Cervical exam deferred        Extremities: Normal range of motion.  Edema: Mild pitting, slight indentation  Mental Status: Normal mood and affect. Normal behavior. Normal judgment and thought content.   Urinalysis:      Assessment and Plan:  Pregnancy: G1P0 at [redacted]w[redacted]d  1. Supervision of normal first pregnancy, antepartum Rx: - Cervicovaginal ancillary only( [place]) - Strep Gp B NAA   Preterm labor symptoms and general obstetric precautions including but not limited to vaginal bleeding, contractions, leaking of fluid and fetal movement were reviewed in detail with the patient. Please refer to After Visit Summary for other counseling recommendations.   Return in about 1 week (around 01/23/2021) for ROB.   Brock Bad, MD  01/16/21

## 2021-01-16 NOTE — Progress Notes (Signed)
ROB GBS 

## 2021-01-17 LAB — CERVICOVAGINAL ANCILLARY ONLY
Chlamydia: NEGATIVE
Comment: NEGATIVE
Comment: NORMAL
Neisseria Gonorrhea: NEGATIVE

## 2021-01-18 LAB — STREP GP B NAA: Strep Gp B NAA: NEGATIVE

## 2021-01-19 ENCOUNTER — Encounter: Payer: BC Managed Care – PPO | Admitting: Obstetrics & Gynecology

## 2021-01-23 ENCOUNTER — Encounter: Payer: Self-pay | Admitting: Obstetrics

## 2021-01-23 ENCOUNTER — Ambulatory Visit (INDEPENDENT_AMBULATORY_CARE_PROVIDER_SITE_OTHER): Payer: BC Managed Care – PPO | Admitting: Obstetrics

## 2021-01-23 ENCOUNTER — Other Ambulatory Visit: Payer: Self-pay

## 2021-01-23 DIAGNOSIS — Z3400 Encounter for supervision of normal first pregnancy, unspecified trimester: Secondary | ICD-10-CM

## 2021-01-23 NOTE — Progress Notes (Signed)
Subjective:  Jasmin Herman is a 20 y.o. G1P0 at [redacted]w[redacted]d being seen today for ongoing prenatal care.  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  Patient reports no complaints.  Contractions: Not present. Vag. Bleeding: None.  Movement: Present. Denies leaking of fluid.   The following portions of the patient's history were reviewed and updated as appropriate: allergies, current medications, past family history, past medical history, past social history, past surgical history and problem list. Problem list updated.  Objective:   Vitals:   01/23/21 1542  BP: 124/81  Pulse: (!) 101  Weight: 142 lb (64.4 kg)    Fetal Status:     Movement: Present     General:  Alert, oriented and cooperative. Patient is in no acute distress.  Skin: Skin is warm and dry. No rash noted.   Cardiovascular: Normal heart rate noted  Respiratory: Normal respiratory effort, no problems with respiration noted  Abdomen: Soft, gravid, appropriate for gestational age. Pain/Pressure: Absent     Pelvic:  Cervical exam deferred        Extremities: Normal range of motion.     Mental Status: Normal mood and affect. Normal behavior. Normal judgment and thought content.   Urinalysis:      Assessment and Plan:  Pregnancy: G1P0 at 105w0d  1. Supervision of normal first pregnancy, antepartum   Term labor symptoms and general obstetric precautions including but not limited to vaginal bleeding, contractions, leaking of fluid and fetal movement were reviewed in detail with the patient. Please refer to After Visit Summary for other counseling recommendations.   Return in about 1 week (around 01/30/2021) for ROB.   Brock Bad, MD  01/23/21

## 2021-01-29 ENCOUNTER — Encounter (HOSPITAL_COMMUNITY): Payer: Self-pay | Admitting: Obstetrics and Gynecology

## 2021-01-29 ENCOUNTER — Inpatient Hospital Stay (HOSPITAL_COMMUNITY): Payer: BC Managed Care – PPO | Admitting: Anesthesiology

## 2021-01-29 ENCOUNTER — Other Ambulatory Visit: Payer: Self-pay

## 2021-01-29 ENCOUNTER — Inpatient Hospital Stay (HOSPITAL_COMMUNITY)
Admission: AD | Admit: 2021-01-29 | Discharge: 2021-01-31 | DRG: 807 | Disposition: A | Discharge status: Home / Self Care | Payer: BC Managed Care – PPO | Attending: Obstetrics & Gynecology | Admitting: Obstetrics & Gynecology

## 2021-01-29 DIAGNOSIS — Z880 Allergy status to penicillin: Secondary | ICD-10-CM | POA: Diagnosis not present

## 2021-01-29 DIAGNOSIS — Z3A37 37 weeks gestation of pregnancy: Secondary | ICD-10-CM

## 2021-01-29 DIAGNOSIS — O134 Gestational [pregnancy-induced] hypertension without significant proteinuria, complicating childbirth: Secondary | ICD-10-CM | POA: Diagnosis present

## 2021-01-29 DIAGNOSIS — D563 Thalassemia minor: Secondary | ICD-10-CM | POA: Diagnosis present

## 2021-01-29 DIAGNOSIS — Z20822 Contact with and (suspected) exposure to covid-19: Secondary | ICD-10-CM | POA: Diagnosis present

## 2021-01-29 DIAGNOSIS — O43123 Velamentous insertion of umbilical cord, third trimester: Secondary | ICD-10-CM | POA: Diagnosis present

## 2021-01-29 DIAGNOSIS — Z148 Genetic carrier of other disease: Secondary | ICD-10-CM

## 2021-01-29 DIAGNOSIS — O4202 Full-term premature rupture of membranes, onset of labor within 24 hours of rupture: Secondary | ICD-10-CM | POA: Diagnosis not present

## 2021-01-29 DIAGNOSIS — O26893 Other specified pregnancy related conditions, third trimester: Secondary | ICD-10-CM | POA: Diagnosis present

## 2021-01-29 DIAGNOSIS — O43193 Other malformation of placenta, third trimester: Secondary | ICD-10-CM | POA: Diagnosis not present

## 2021-01-29 DIAGNOSIS — Z3400 Encounter for supervision of normal first pregnancy, unspecified trimester: Secondary | ICD-10-CM

## 2021-01-29 LAB — CBC
HCT: 35.6 % — ABNORMAL LOW (ref 36.0–46.0)
Hemoglobin: 11.5 g/dL — ABNORMAL LOW (ref 12.0–15.0)
MCH: 23.6 pg — ABNORMAL LOW (ref 26.0–34.0)
MCHC: 32.3 g/dL (ref 30.0–36.0)
MCV: 73 fL — ABNORMAL LOW (ref 80.0–100.0)
Platelets: 285 10*3/uL (ref 150–400)
RBC: 4.88 MIL/uL (ref 3.87–5.11)
RDW: 14.7 % (ref 11.5–15.5)
WBC: 14.3 10*3/uL — ABNORMAL HIGH (ref 4.0–10.5)
nRBC: 0 % (ref 0.0–0.2)

## 2021-01-29 LAB — COMPREHENSIVE METABOLIC PANEL
ALT: 12 U/L (ref 0–44)
AST: 25 U/L (ref 15–41)
Albumin: 3.2 g/dL — ABNORMAL LOW (ref 3.5–5.0)
Alkaline Phosphatase: 182 U/L — ABNORMAL HIGH (ref 38–126)
Anion gap: 11 (ref 5–15)
BUN: 10 mg/dL (ref 6–20)
CO2: 19 mmol/L — ABNORMAL LOW (ref 22–32)
Calcium: 8.9 mg/dL (ref 8.9–10.3)
Chloride: 105 mmol/L (ref 98–111)
Creatinine, Ser: 0.66 mg/dL (ref 0.44–1.00)
GFR, Estimated: >60 mL/min (ref >60)
Glucose, Bld: 71 mg/dL (ref 70–99)
Potassium: 4.3 mmol/L (ref 3.5–5.1)
Sodium: 135 mmol/L (ref 135–145)
Total Bilirubin: 0.5 mg/dL (ref 0.3–1.2)
Total Protein: 6.8 g/dL (ref 6.5–8.1)

## 2021-01-29 LAB — PROTEIN / CREATININE RATIO, URINE
Creatinine, Urine: 35.6 mg/dL
Total Protein, Urine: <6 mg/dL

## 2021-01-29 LAB — RESP PANEL BY RT-PCR (FLU A&B, COVID) ARPGX2
Influenza A by PCR: NEGATIVE
Influenza B by PCR: NEGATIVE
SARS Coronavirus 2 by RT PCR: NEGATIVE

## 2021-01-29 LAB — TYPE AND SCREEN
ABO/RH(D): B POS
Antibody Screen: NEGATIVE

## 2021-01-29 LAB — POCT FERN TEST: POCT Fern Test: POSITIVE

## 2021-01-29 MED ORDER — EPHEDRINE 5 MG/ML INJ: 10.0000 mg | INTRAVENOUS | Status: DC | PRN

## 2021-01-29 MED ORDER — LACTATED RINGERS IV SOLN: INTRAVENOUS | Status: DC

## 2021-01-29 MED ORDER — LIDOCAINE HCL (PF) 1 % IJ SOLN: 30.0000 mL | INTRAMUSCULAR | Status: DC | PRN

## 2021-01-29 MED ORDER — LIDOCAINE HCL (PF) 1 % IJ SOLN
INTRAMUSCULAR | Status: DC | PRN
  Administered 2021-01-29 (×2): 4 mL via EPIDURAL

## 2021-01-29 MED ORDER — FENTANYL-BUPIVACAINE-NACL 0.5-0.125-0.9 MG/250ML-% EP SOLN
12.0000 mL/h | EPIDURAL | Status: DC | PRN
  Administered 2021-01-29: 12 mL/h via EPIDURAL
  Filled 2021-01-29: qty 250

## 2021-01-29 MED ORDER — ONDANSETRON HCL 4 MG/2ML IJ SOLN: 4.0000 mg | INTRAMUSCULAR | Status: DC | PRN

## 2021-01-29 MED ORDER — SIMETHICONE 80 MG PO CHEW
80.0000 mg | CHEWABLE_TABLET | ORAL | Status: DC | PRN
Start: 2021-01-29 — End: 2021-01-31

## 2021-01-29 MED ORDER — OXYTOCIN-SODIUM CHLORIDE 30-0.9 UT/500ML-% IV SOLN
2.5000 [IU]/h | INTRAVENOUS | Status: DC
  Filled 2021-01-29: qty 500

## 2021-01-29 MED ORDER — FENTANYL CITRATE (PF) 100 MCG/2ML IJ SOLN: 100.0000 ug | INTRAMUSCULAR | Status: DC | PRN

## 2021-01-29 MED ORDER — SENNOSIDES-DOCUSATE SODIUM 8.6-50 MG PO TABS
2.0000 | ORAL_TABLET | ORAL | Status: DC
  Administered 2021-01-30 – 2021-01-31 (×2): 2 via ORAL
  Filled 2021-01-29 (×2): qty 2

## 2021-01-29 MED ORDER — ACETAMINOPHEN 325 MG PO TABS: 650.0000 mg | ORAL_TABLET | ORAL | Status: DC | PRN

## 2021-01-29 MED ORDER — DIPHENHYDRAMINE HCL 25 MG PO CAPS: 25.0000 mg | ORAL_CAPSULE | Freq: Four times a day (QID) | ORAL | Status: DC | PRN

## 2021-01-29 MED ORDER — SOD CITRATE-CITRIC ACID 500-334 MG/5ML PO SOLN: 30.0000 mL | ORAL | Status: DC | PRN

## 2021-01-29 MED ORDER — PRENATAL MULTIVITAMIN CH
1.0000 | ORAL_TABLET | Freq: Every day | ORAL | Status: DC
  Administered 2021-01-30 – 2021-01-31 (×2): 1 via ORAL
  Filled 2021-01-29 (×2): qty 1

## 2021-01-29 MED ORDER — MEASLES, MUMPS & RUBELLA VAC IJ SOLR: 0.5000 mL | Freq: Once | INTRAMUSCULAR | Status: DC

## 2021-01-29 MED ORDER — IBUPROFEN 600 MG PO TABS
600.0000 mg | ORAL_TABLET | Freq: Four times a day (QID) | ORAL | Status: DC
  Administered 2021-01-30 – 2021-01-31 (×6): 600 mg via ORAL
  Filled 2021-01-29 (×6): qty 1

## 2021-01-29 MED ORDER — ONDANSETRON HCL 4 MG PO TABS: 4.0000 mg | ORAL_TABLET | ORAL | Status: DC | PRN

## 2021-01-29 MED ORDER — ONDANSETRON HCL 4 MG/2ML IJ SOLN
4.0000 mg | Freq: Four times a day (QID) | INTRAMUSCULAR | Status: DC | PRN
  Administered 2021-01-29: 4 mg via INTRAVENOUS
  Filled 2021-01-29: qty 2

## 2021-01-29 MED ORDER — TETANUS-DIPHTH-ACELL PERTUSSIS 5-2.5-18.5 LF-MCG/0.5 IM SUSY: 0.5000 mL | PREFILLED_SYRINGE | Freq: Once | INTRAMUSCULAR | Status: DC

## 2021-01-29 MED ORDER — OXYCODONE-ACETAMINOPHEN 5-325 MG PO TABS: 2.0000 | ORAL_TABLET | ORAL | Status: DC | PRN

## 2021-01-29 MED ORDER — PHENYLEPHRINE 40 MCG/ML (10ML) SYRINGE FOR IV PUSH (FOR BLOOD PRESSURE SUPPORT): 80.0000 ug | PREFILLED_SYRINGE | INTRAVENOUS | Status: DC | PRN

## 2021-01-29 MED ORDER — OXYCODONE-ACETAMINOPHEN 5-325 MG PO TABS: 1.0000 | ORAL_TABLET | ORAL | Status: DC | PRN

## 2021-01-29 MED ORDER — OXYTOCIN BOLUS FROM INFUSION
333.0000 mL | Freq: Once | INTRAVENOUS | Status: AC
  Administered 2021-01-29: 333 mL via INTRAVENOUS

## 2021-01-29 MED ORDER — DIPHENHYDRAMINE HCL 50 MG/ML IJ SOLN: 12.5000 mg | INTRAMUSCULAR | Status: DC | PRN

## 2021-01-29 MED ORDER — OXYTOCIN-SODIUM CHLORIDE 30-0.9 UT/500ML-% IV SOLN
1.0000 m[IU]/min | INTRAVENOUS | Status: DC
  Administered 2021-01-29: 2 m[IU]/min via INTRAVENOUS

## 2021-01-29 MED ORDER — BENZOCAINE-MENTHOL 20-0.5 % EX AERO: 1.0000 "application " | INHALATION_SPRAY | CUTANEOUS | Status: DC | PRN

## 2021-01-29 MED ORDER — LACTATED RINGERS IV SOLN: 500.0000 mL | INTRAVENOUS | Status: DC | PRN

## 2021-01-29 MED ORDER — FLEET ENEMA 7-19 GM/118ML RE ENEM: 1.0000 | ENEMA | RECTAL | Status: DC | PRN

## 2021-01-29 MED ORDER — WITCH HAZEL-GLYCERIN EX PADS: 1.0000 "application " | MEDICATED_PAD | CUTANEOUS | Status: DC | PRN

## 2021-01-29 MED ORDER — DIBUCAINE (PERIANAL) 1 % EX OINT: 1.0000 "application " | TOPICAL_OINTMENT | CUTANEOUS | Status: DC | PRN

## 2021-01-29 MED ORDER — LACTATED RINGERS IV SOLN: 500.0000 mL | Freq: Once | INTRAVENOUS | Status: DC

## 2021-01-29 MED ORDER — TERBUTALINE SULFATE 1 MG/ML IJ SOLN: 0.2500 mg | Freq: Once | INTRAMUSCULAR | Status: DC | PRN

## 2021-01-29 MED ORDER — COCONUT OIL OIL: 1.0000 "application " | TOPICAL_OIL | Status: DC | PRN

## 2021-01-29 NOTE — H&P (Signed)
HPI: Jasmin Herman is a 20 y.o. year old G1P0 female at [redacted]w[redacted]d weeks gestation who presents to MAU reporting Spontaneous rupture of membranes at 0800 and mild contractions.   Missed Anatomy US.    Nursing Staff Provider  Office Location  [facility] Dating  LMP c/w 9 week [place]  Language  English Anatomy US  scheduled  Flu Vaccine   Declined 07/11/2020 Genetic Screen  NIPS: low risk female   AFP: negative  TDaP Vaccine   12/27/20 Hgb A1C or  GTT Third trimester  Glucose, Fasting 65 - 91 mg/dL 77   Glucose, 1 hour 65 - 179 mg/dL 330   Glucose, 2 hour 65 - 152 mg/dL 94    COVID Vaccine  Completed   LAB RESULTS   Rhogam  n/a Blood Type B/Positive/-- (10/25 1029)   Feeding Plan Breast Antibody Negative (10/25 1029)  Contraception Undecided, info given ?Pill Rubella 2.13 (10/25 1029)  Circumcision Yes RPR Non Reactive (02/28 1110)   Pediatrician   unsure, list given HBsAg Negative (10/25 1029)   Support Person FOB HCVAb negative  Prenatal Classes Info given HIV Non Reactive (02/28 1110)     BTL Consent N/A GBS   (For PCN allergy, check sensitivities)   VBAC Consent N/A Pap wnl on 10/25    Hgb Electro  Silent carrier Alpha-thal, Hemoglobin E carrier  BP Cuff  Ordered 07/11/2020 CF Neg Horizon    SMA Neg Horizon    Waterbirth  [ ]  Class [ ]  Consent [ ]  CNM visit    Induction  [ ]  Orders Entered [ ] Foley Y/N   OB History    Gravida  1   Para      Term      Preterm      AB      Living        SAB      IAB      Ectopic      Multiple      Live Births             Past Medical History:  Diagnosis Date  . Medical history non-contributory    Past Surgical History:  Procedure Laterality Date  . NO PAST SURGERIES     Family History: family history is not on file. Social History:  reports that she has never smoked. She has never used smokeless tobacco. She reports that she does not drink alcohol and does not use drugs.     Maternal Diabetes: No Genetic Screening:  Normal Maternal Ultrasounds/Referrals: Normal Fetal Ultrasounds or other Referrals:  None Maternal Substance Abuse:  No Significant Maternal Medications:  None Significant Maternal Lab Results:  Group B Strep negative and Other:  Other Comments:  Mom is carrier for Hgb E and Alpha Thal. FOB carrier status unknown.   Review of Systems  Constitutional: Negative for chills and fever.  Eyes: Negative for visual disturbance.  Gastrointestinal: Negative for nausea and vomiting.  Genitourinary: Negative for vaginal bleeding.       + LOF since 0800  Neurological: Negative for headaches.   Maternal Medical History:  Reason for admission: Rupture of membranes and contractions.  Nausea.  Contractions: Onset was 6-12 hours ago.   Frequency: irregular.   Perceived severity is moderate.    Fetal activity: Perceived fetal activity is normal.    Prenatal complications: No bleeding.   PIH: x 1.     Dilation: 5 Exam by:: Simpson, Blood pressure (!) 146/85, pulse (!) 105, temperature 98.1  F (36.7 C), temperature source Oral, resp. rate 16, height 5\' 5"  (1.651 m), weight 64.4 kg, last menstrual period 04/30/2020, SpO2 100 %. Exam Physical Exam  Prenatal labs: ABO, Rh: --/--/B POS (05/15 1340) Antibody: NEG (05/15 1340) Rubella: 2.13 (10/25 1029) RPR: Non Reactive (02/28 1110)  HBsAg: Negative (10/25 1029)  HIV: Non Reactive (02/28 1110)  GBS: Negative/-- (05/02 01-07-2000)   Assessment: 1. Labor: Early, ROM x 6 hours 2. Fetal Wellbeing: Category I  3. Pain Control: Planning epidural 4. GBS: Neg 5. 37.6 week IUP 6. Transient HTN  Plan:  1. Admit to BS per consult with MD 2. Routine L&D orders 3. Analgesia/anesthesia PRN  4. Expectant management of labor.  5. Pre-E labs. Watch BP.  4315 01/29/2021, 2:27 PM

## 2021-01-29 NOTE — Progress Notes (Signed)
Jasmin Herman is a 20 y.o. G1P0 at [redacted]w[redacted]d.  Subjective: Comfortable w/ epidural.   Objective: BP 127/82   Pulse 88   Temp 98.6 F (37 C) (Axillary)   Resp 16   Ht 5\' 5"  (1.651 m)   Wt 64.4 kg   LMP 04/30/2020 (Approximate)   SpO2 100%   BMI 23.63 kg/m    FHT:  FHR: 145 bpm, variability: mod,  accelerations:  15x15,  decelerations:  [place] UC:   Q 2-3 minutes, mod-strong Dilation: 7 Effacement (%): 100 Station: Plus 1 Presentation: Vertex Exam by:: [email or web address], CNM  Labs: Results for orders placed or performed during the hospital encounter of 01/29/21 (from the past 24 hour(s))  Resp Panel by RT-PCR (Flu A&B, Covid) Nasopharyngeal Swab     Status: None   Collection Time: 01/29/21  1:27 PM   Specimen: Nasopharyngeal Swab; Nasopharyngeal(NP) swabs in vial transport medium  Result Value Ref Range   SARS Coronavirus 2 by RT PCR NEGATIVE NEGATIVE   Influenza A by PCR NEGATIVE NEGATIVE   Influenza B by PCR NEGATIVE NEGATIVE  CBC     Status: Abnormal   Collection Time: 01/29/21  1:28 PM  Result Value Ref Range   WBC 14.3 (H) 4.0 - 10.5 K/uL   RBC 4.88 3.87 - 5.11 MIL/uL   Hemoglobin 11.5 (L) 12.0 - 15.0 g/dL   HCT 01/31/21 (L) 44.0 - 10.2 %   MCV 73.0 (L) 80.0 - 100.0 fL   MCH 23.6 (L) 26.0 - 34.0 pg   MCHC 32.3 30.0 - 36.0 g/dL   RDW 72.5 36.6 - 44.0 %   Platelets 285 150 - 400 K/uL   nRBC 0.0 0.0 - 0.2 %  Type and screen  [facility]     Status: None   Collection Time: 01/29/21  1:40 PM  Result Value Ref Range   ABO/RH(D) B POS    Antibody Screen NEG    Sample Expiration      02/01/2021,2359 Performed at [facility], [address], [place], [place]   Comprehensive metabolic panel     Status: Abnormal   Collection Time: 01/29/21  1:42 PM  Result Value Ref Range   Sodium 135 135 - 145 mmol/L   Potassium 4.3 3.5 - 5.1 mmol/L   Chloride 105 98 - 111 mmol/L   CO2 19 (L) 22 - 32 mmol/L   Glucose, Bld 71 70 - 99 mg/dL   BUN 10 6 - 20  mg/dL   Creatinine, Ser 01/31/21 0.44 - 1.00 mg/dL   Calcium 8.9 8.9 - 6.38 mg/dL   Total Protein 6.8 6.5 - 8.1 g/dL   Albumin 3.2 (L) 3.5 - 5.0 g/dL   AST 25 15 - 41 U/L   ALT 12 0 - 44 U/L   Alkaline Phosphatase 182 (H) 38 - 126 U/L   Total Bilirubin 0.5 0.3 - 1.2 mg/dL   GFR, Estimated 75.6 >43 mL/min   Anion gap 11 5 - 15  Fern Test     Status: Abnormal   Collection Time: 01/29/21  2:19 PM  Result Value Ref Range   POCT Fern Test Positive = ruptured amniotic membanes   Protein / creatinine ratio, urine     Status: None   Collection Time: 01/29/21  3:25 PM  Result Value Ref Range   Creatinine, Urine 35.60 mg/dL   Total Protein, Urine <6 mg/dL   Protein Creatinine Ratio        0.00 - 0.15  mg/mg[Cre]    Assessment / Plan: [redacted]w[redacted]d week IUP Labor: Active, progressing sloly, but contractions may not be adequate. Pt request expectant management for now. Consider Pitocin/IUPC at next check PRN.  Fetal Wellbeing:  Category I Pain Control:  Epidural Anticipated MOD:  SVD  Katrinka Blazing [place], CNM 01/29/2021 6:30 PM

## 2021-01-29 NOTE — Progress Notes (Addendum)
Labor Progress Note Jasmin Herman is Jasmin Herman 20 y.o. G1P0 at [redacted]w[redacted]d presenting for SOL/SROM @0800 .   S: Patient is resting in bed with epidural. Reports intermittent nausea.    O:  BP 103/77   Pulse (!) 140   Temp 98.6 F (37 C) (Axillary)   Resp 16   Ht 5\' 5"  (1.651 m)   Wt 64.4 kg   LMP 04/30/2020 (Approximate)   SpO2 100%   BMI 23.63 kg/m  EFM: baseline 140 bpm/mod variability/+accels/intermittent early & late decels with contractions  Toco: q1-4 min  CVE: Dilation: 10 Dilation Complete Date: 01/29/21 Dilation Complete Time: 2033 Effacement (%): 100 Station: Plus 1 Presentation: Vertex Exam by:: Dr. [id number]   Jasmin Herman&P: 20 y.o. G1P0 [redacted]w[redacted]d who presented in SOL/SROM @0800 .  #Labor: Progressing well. Now complete, instructed to begin pushing.  #Pain: Epidural #FWB: Cat 2, overall reassuring, expect delivery shortly #GBS negative  #Elevated BP: No prior diagnosis. BP today mildly elevated (highest 146/91), although BP in last 3 hrs wnl. Currently no symptoms, preE labs negative. Continue to closely monitor       36, Medical Student Jasmin Herman:00 PM  GME ATTESTATION:  I saw and evaluated the patient. I agree with the findings and the plan of care as documented in the medical student's note.  [redacted]w[redacted]d, MD OB Fellow, [facility], Center for [facility] 01/29/2021 10:36 PM

## 2021-01-29 NOTE — Anesthesia Procedure Notes (Signed)
Epidural Patient location during procedure: OB Start time: 01/29/2021 2:28 PM End time: 01/29/2021 2:36 PM  Staffing Anesthesiologist: Mal Amabile, MD Performed: anesthesiologist   Preanesthetic Checklist Completed: patient identified, IV checked, site marked, risks and benefits discussed, surgical consent, monitors and equipment checked, pre-op evaluation and timeout performed  Epidural Patient position: sitting Prep: DuraPrep and site prepped and draped Patient monitoring: continuous pulse ox and blood pressure Approach: midline Location: L3-L4 Injection technique: LOR air  Needle:  Needle type: Tuohy  Needle gauge: 17 G Needle length: 9 cm and 9 Needle insertion depth: 4 cm Catheter type: closed end flexible Catheter size: 19 Gauge Catheter at skin depth: 9 cm Test dose: negative and Other  Assessment Events: blood not aspirated, injection not painful, no injection resistance, no paresthesia and negative IV test  Additional Notes Patient identified. Risks and benefits discussed including failed block, incomplete  Pain control, post dural puncture headache, nerve damage, paralysis, blood pressure Changes, nausea, vomiting, reactions to medications-both toxic and allergic and post Partum back pain. All questions were answered. Patient expressed understanding and wished to proceed. Sterile technique was used throughout procedure. Epidural site was Dressed with sterile barrier dressing. No paresthesias, signs of intravascular injection Or signs of intrathecal spread were encountered.  Patient was more comfortable after the epidural was dosed. Please see RN's note for documentation of vital signs and FHR which are stable. Reason for block:procedure for pain

## 2021-01-29 NOTE — Anesthesia Preprocedure Evaluation (Signed)
Anesthesia Evaluation  Patient identified by MRN, date of birth, ID band Patient awake    Reviewed: Allergy & Precautions, Patient's Chart, lab work & pertinent test results  Airway Mallampati: II  TM Distance: >3 FB Neck ROM: Full    Dental no notable dental hx. (+) Teeth Intact   Pulmonary neg pulmonary ROS,    Pulmonary exam normal breath sounds clear to auscultation       Cardiovascular negative cardio ROS Normal cardiovascular exam Rhythm:Regular Rate:Normal     Neuro/Psych negative neurological ROS  negative psych ROS   GI/Hepatic Neg liver ROS, GERD  ,  Endo/Other  negative endocrine ROS  Renal/GU negative Renal ROS  negative genitourinary   Musculoskeletal negative musculoskeletal ROS (+)   Abdominal   Peds  Hematology  (+) anemia , Alpha Thalassemia carrier   Anesthesia Other Findings   Reproductive/Obstetrics (+) Pregnancy                             Anesthesia Physical Anesthesia Plan  ASA: II  Anesthesia Plan: Epidural   Post-op Pain Management:    Induction:   PONV Risk Score and Plan:   Airway Management Planned: Natural Airway  Additional Equipment:   Intra-op Plan:   Post-operative Plan:   Informed Consent: I have reviewed the patients History and Physical, chart, labs and discussed the procedure including the risks, benefits and alternatives for the proposed anesthesia with the patient or authorized representative who has indicated his/her understanding and acceptance.       Plan Discussed with: Anesthesiologist  Anesthesia Plan Comments:         Anesthesia Quick Evaluation

## 2021-01-29 NOTE — MAU Note (Signed)
Jasmin Herman is a 20 y.o. at [redacted]w[redacted]d here in MAU reporting: contractions and LOF since 0800. Contractions come every 10-15 minutes. Fluid is clear. No bleeding. +FM. Last IC was yesterday  Onset of complaint: today  Pain score: 5/10  Vitals:   01/29/21 1202  BP: 131/77  Pulse: (!) 105  Resp: 16  Temp: 98.1 F (36.7 C)  SpO2: 100%     FHT: 138  Lab orders placed from triage: none

## 2021-01-29 NOTE — Discharge Instructions (Signed)

## 2021-01-29 NOTE — Discharge Summary (Signed)
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 yo G1P0 at [redacted]w[redacted]d was admitted in Active Labor on 01/29/2021. Patient had an uncomplicated labor course as follows:  Membrane Rupture Time/Date: 8:00 AM ,01/29/2021   Delivery Method:Vaginal, Spontaneous  Episiotomy: None  Lacerations:  Sulcus;Labial  Patient had an uncomplicated postpartum course.  She is ambulating, tolerating a regular diet, passing flatus, and urinating well. Patient is discharged home in stable condition on 01/31/21. Given persistent elevated blood pressures s/p delivery, pt was started on norvasc $RemoveBe'5mg'IStHADHHO$  daily with plan for 1 week blood pressure check in clinic.  Newborn Data: Birth date:01/29/2021  Birth time:10:05 PM  Gender:Female  Living status:Living  Apgars:9 ,9  Weight:2566 g   Magnesium Sulfate received: No BMZ received: No Rhophylac:N/A MMR:N/A T-DaP:Given prenatally Flu: No Transfusion:No  Physical exam  Vitals:   01/30/21 1447 01/30/21 2010 01/31/21 0514 01/31/21 1140  BP: 110/77 115/78 118/76 134/77  Pulse: 96 67 65   Resp: $Remo'17 16 18   'bQkCV$ Temp: 98.2 F (36.8 C) 98 F (36.7 C) 98.1 F (36.7 C)   TempSrc: Oral Oral Oral   SpO2: 99% 100%    Weight:      Height:       General:  alert, cooperative and no distress Lochia: appropriate Uterine Fundus: firm Incision: N/A DVT Evaluation: No evidence of DVT seen on physical exam. No cords or calf tenderness. No significant calf/ankle edema. Labs: Lab Results  Component Value Date   WBC 14.3 (H) 01/29/2021   HGB 11.5 (L) 01/29/2021   HCT 35.6 (L) 01/29/2021   MCV 73.0 (L) 01/29/2021   PLT 285 01/29/2021   CMP Latest Ref Rng & Units 01/29/2021  Glucose 70 - 99 mg/dL 71  BUN 6 - 20 mg/dL 10  Creatinine 0.44 - 1.00 mg/dL 0.66  Sodium 135 - 145 mmol/L 135  Potassium 3.5 - 5.1 mmol/L 4.3  Chloride 98 - 111 mmol/L 105  CO2 22 - 32 mmol/L 19(L)  Calcium 8.9 - 10.3 mg/dL 8.9  Total Protein 6.5 - 8.1 g/dL 6.8  Total Bilirubin 0.3 - 1.2 mg/dL 0.5  Alkaline Phos 38 - 126 U/L 182(H)  AST 15 - 41 U/L 25  ALT 0 - 44 U/L 12   Edinburgh Score: Edinburgh Postnatal Depression Scale Screening Tool 01/31/2021  I have been able to laugh and see the funny side of things. 0  I have looked forward with enjoyment to things. 0  I have blamed myself unnecessarily when things went wrong. 2  I have been anxious or worried for no good reason. 2  I have felt  scared or panicky for no good reason. 2  Things have been getting on top of me. 1  I have been so unhappy that I have had difficulty sleeping. 0  I have felt sad or miserable. 1  I have been so unhappy that I have been crying. 1  The thought of harming myself has occurred to me. 0  Edinburgh Postnatal Depression Scale Total 9     After visit meds:  Allergies as of 01/31/2021   No Known Allergies     Medication List    STOP taking these medications   calcium carbonate 500 MG chewable tablet Commonly known as: TUMS - dosed in mg elemental calcium   metoCLOPramide 10 MG tablet Commonly known as: Reglan     TAKE these medications   acetaminophen 325 MG tablet Commonly known as: Tylenol Take 2 tablets (650 mg total) by mouth every 6 (six) hours as needed for mild  pain, moderate pain, fever or headache (for pain scale < 4).   amLODipine 5 MG tablet Commonly known as: NORVASC Take 1 tablet (5 mg total) by mouth daily.   Blood Pressure Kit Devi 1 kit by Does not apply route once a week. Check Blood Pressure regularly and record readings into the Bab[employer] App.  Large Cuff.  DX O90.0   coconut oil Oil Apply 1 application topically as needed (nipple pain).   ibuprofen 600 MG tablet Commonly known as: ADVIL Take 1 tablet (600 mg total) by mouth every 8 (eight) hours as needed for moderate pain or cramping.   norethindrone 0.35 MG tablet Commonly known as: Ortho Micronor Take 1 tablet (0.35 mg total) by mouth daily.   PNV-Select 27-0.6-0.4 MG Tabs Take 1 tablet by mouth daily.       Discharge home in stable condition Infant Feeding: Breast Infant Disposition:home with mother Discharge instruction: per After Visit Summary and Postpartum booklet. Activity: Advance as tolerated. Pelvic rest for 6 weeks.  Diet: routine diet Future Appointments: Future Appointments  Date Time Provider [place]  02/06/2021 10:00 AM [place] None  03/13/2021  9:15 AM Ephraim Hamburger, Rona Ravens, NP [place] None   Follow up Visit: Message sent to [facility] 01/29/21 by Sylvester Harder.  Please schedule this patient for a In person postpartum visit in 6 weeks with the following provider: Any provider. Additional Postpartum F/U:BP check 1 week  Low risk pregnancy complicated by: n/a Delivery mode:  Vaginal, Spontaneous  Anticipated Birth Control:  POPs  Angelea Penny, Gildardo Cranker, MD OB Fellow, Faculty Practice 01/31/2021 12:12 PM

## 2021-01-29 NOTE — MAU Provider Note (Signed)
Event Date/Time   First Provider Initiated Contact with Patient 01/29/21 1305      S [name] is a 21 y.o. G1P0 patient who presents to MAU today with complaint of contractions and leaking fluid. Reports she started having contractions every 10-15 mins at 0800. Around the same time she started leaking clear fluid. It was only a small amount, did not have to wear a pad however had to change her underwear 2 times. Denies vaginal bleeding. Endorses active fetal movement  O BP 131/77   Pulse (!) 105   Temp 98.1 F (36.7 C) (Oral)   Resp 16   LMP 04/30/2020 (Approximate)   SpO2 100%  Physical Exam Cardiovascular:     Rate and Rhythm: Normal rate.  Pulmonary:     Effort: Pulmonary effort is normal.  Abdominal:     Palpations: Abdomen is soft.     Comments: Nontender, gravid   Genitourinary:    General: Normal vulva.     Comments: Pooling of clear/pink tinged fluid, no bleeding. Cervix 5/90/-1,0, vertex confirmed by BSUS Neurological:     Mental Status: She is alert.    Fern slide positive FHT: 135 bpm, moderate variability, +accels, no decels Toco: Q3-5 mins   A Medical screening exam complete Spontaneous rupture of membranes Latent labor   P Admit to labor and delivery RN to call labor team    Camelia Eng, CNM 01/29/2021 1:23 PM

## 2021-01-29 NOTE — Lactation Note (Signed)
This note was copied from a baby's chart. Lactation Consultation Note  Patient Name: Jasmin Herman SHFWY'O Date: 01/29/2021 Reason for consult: L&D Initial assessment;1st time breastfeeding;Early term 37-38.6wks Age:22 hours P1, ETI female infant. LC entered the room in L&D, mom was doing STS and infant was cuing to breastfeed. Mom latched infant on her left breast at first and infant was not sustaining latch using the cross cradle hold after 10 minutes infant was switch to mom's right breast using the football hold positon. Mom using the football hold position on her right breast infant breastfeed for another 10 minutes and started sustaining the latch towards the end of the feeding. Mom was taught hand expression and she taught back and infant was given 2 mls of colostrum by spoon. Mom knows if she needs further assistance with latch to ask MBU RN or LC for latch assistance. Mom knows to breastfeed infant according to primal feeding cues: rooting, hands in mouth, licking, kissing breast and to breastfeed infant STS.  Maternal Data Has patient been taught Hand Expression?: Yes Does the patient have breastfeeding experience prior to this delivery?: No  Feeding Mother's Current Feeding Choice: Breast Milk  LATCH Score Latch: Repeated attempts needed to sustain latch, nipple held in mouth throughout feeding, stimulation needed to elicit sucking reflex.  Audible Swallowing: A few with stimulation  Type of Nipple: Everted at rest and after stimulation  Comfort (Breast/Nipple): Soft / non-tender  Hold (Positioning): Assistance needed to correctly position infant at breast and maintain latch.  LATCH Score: 7   Lactation Tools Discussed/Used    Interventions Interventions: Breast feeding basics reviewed;Skin to skin;Assisted with latch;Breast compression;Adjust position;Support pillows;Position options;Expressed milk;Education  Discharge    Consult Status Consult Status:  Follow-up Date: 01/30/21 Follow-up type: In-patient    Danelle Earthly 01/29/2021, 11:14 PM

## 2021-01-30 ENCOUNTER — Encounter: Payer: BC Managed Care – PPO | Admitting: Obstetrics & Gynecology

## 2021-01-30 LAB — RPR: RPR Ser Ql: NONREACTIVE

## 2021-01-30 MED ORDER — AMLODIPINE BESYLATE 5 MG PO TABS
5.0000 mg | ORAL_TABLET | Freq: Every day | ORAL | Status: DC
  Administered 2021-01-30 – 2021-01-31 (×2): 5 mg via ORAL
  Filled 2021-01-30 (×2): qty 1

## 2021-01-30 NOTE — Anesthesia Postprocedure Evaluation (Signed)
Anesthesia Post Note  Patient: Jasmin Herman  Procedure(s) Performed: AN AD HOC LABOR EPIDURAL     Patient location during evaluation: Mother Baby Anesthesia Type: Epidural Level of consciousness: awake Pain management: satisfactory to patient Vital Signs Assessment: post-procedure vital signs reviewed and stable Respiratory status: spontaneous breathing Cardiovascular status: stable Anesthetic complications: no   No complications documented.  Last Vitals:  Vitals:   01/30/21 0500 01/30/21 0949  BP: 138/83 119/80  Pulse: 77 67  Resp: 16 16  Temp: 36.7 C (!) 36.4 C  SpO2: 98% 99%    Last Pain:  Vitals:   01/30/21 0949  TempSrc: Oral  PainSc: 2    Pain Goal:                   Cephus Shelling

## 2021-01-30 NOTE — Lactation Note (Signed)
This note was copied from a baby's chart. Lactation Consultation Note  Patient Name: Jasmin Herman Date: 01/30/2021 Reason for consult: Follow-up assessment;Mother's request;Primapara;1st time breastfeeding;Early term 37-38.6wks;Infant < 6lbs Age:22 hours  LC called to assist with latching. LC did some suck training, infant has high palate and thick labial attachment. LC used 16 NS and 5 french with 5 ml of formula to give more flow and assist with maintaining latch at the breast. Infant emptied formula and continued to transfer milk from the breast for 14 minutes.   Plan 1. To feed based on cues 8-12x in 24 hr period no more than 3 hrs without an attempt. Mom to offer breasts, STS and look for signs of milk transfer. To help maintain latch Mom has 16 NS          2. Dad to paced bottle feeding based on LPTI supplementation guidelines and hrs of age since delivery.           3 Mom to pump q 3 hrs for 15 minutes. Mom to offer EBM first followed by formula with supplementation.    Maternal Data Has patient been taught Hand Expression?: Yes Does the patient have breastfeeding experience prior to this delivery?: No  Feeding Mother's Current Feeding Choice: Breast Milk and Formula Nipple Type: Extra Slow Flow  LATCH Score Latch: Repeated attempts needed to sustain latch, nipple held in mouth throughout feeding, stimulation needed to elicit sucking reflex.  Audible Swallowing: Spontaneous and intermittent  Type of Nipple: Flat (but will invert with stimulation and pre pumping)  Comfort (Breast/Nipple): Soft / non-tender  Hold (Positioning): Assistance needed to correctly position infant at breast and maintain latch.  LATCH Score: 7   Lactation Tools Discussed/Used Tools: 49F feeding tube / Syringe  Interventions Interventions: Breast feeding basics reviewed;Support pillows;Education;Assisted with latch;Position options;Skin to skin;Expressed milk;Breast  compression;DEBP;Adjust position  Discharge Pump: Personal WIC Program: No  Consult Status Consult Status: Follow-up Date: 01/31/21 Follow-up type: In-patient    Jasmin Herman  Nicholson-Springer 01/30/2021, 11:13 PM

## 2021-01-30 NOTE — Lactation Note (Signed)
This note was copied from a baby's chart. Lactation Consultation Note  Patient Name: Jasmin Herman [id number] Date: 01/30/2021 Reason for consult: Follow-up assessment;Mother's request;Difficult latch;Primapara;1st time breastfeeding;Early term 37-38.6wks;Infant < 6lbs Age:22 hours  Mom stated infant completed 15 minute feeding before LC arrival. Infant still putting her hands in her mouth, so LC assisted trying to get infant to latch. Infant tongue sucking but did not show signs of milk transfer during time of the visit.  LC set Mom up on DEBP q3 hrs for 15 minutes and decreased flange size 24 to 21. Mom stated 21 was a better fit. LC reviewed with Mom how to reduce calorie loss and keep total feeding under 30 minutes.  Mom expressed interests to pump and bottle feed her EBM.   Plan 1. To feed based on cues 8-12x per 24 hrs no more than 3 hrs without an attempt. Mom to offer both breasts and look for signs of milk transfer during feeding.         2. Mom to offer EBM via supplemenation guidelines for LPTI based on hrs of age since birth using extra slow flow nipple.          3. I and O sheet reviewed.           4. LC brochure of inpatient and outpatient services reviewed.   LC reviewed findings with RN, Alvester Morin, to observe a latch with next feeding to ensure infant transferring milk ane encourage Mom to supplement her eBM from pumping after each attempt at the breasts.  Maternal Data Has patient been taught Hand Expression?: Yes Does the patient have breastfeeding experience prior to this delivery?: No  Feeding Mother's Current Feeding Choice: Breast Milk  LATCH Score Latch: Repeated attempts needed to sustain latch, nipple held in mouth throughout feeding, stimulation needed to elicit sucking reflex.  Audible Swallowing: A few with stimulation  Type of Nipple: Flat (short shafted but will erect with stimulation)  Comfort (Breast/Nipple): Soft / non-tender  Hold (Positioning):  Assistance needed to correctly position infant at breast and maintain latch.  LATCH Score: 6   Lactation Tools Discussed/Used    Interventions Interventions: Breast feeding basics reviewed;Support pillows;Education;Assisted with latch;Position options;Skin to skin;Expressed milk;Breast compression;DEBP;Adjust position  Discharge Pump: Personal WIC Program: No  Consult Status Consult Status: Follow-up Date: 01/31/21 Follow-up type: In-patient    Ricky Gallery  Nicholson-Springer 01/30/2021, 8:26 PM

## 2021-01-30 NOTE — Progress Notes (Addendum)
POSTPARTUM PROGRESS NOTE  Post Partum Day 1  Subjective:  Jasmin Herman is a 20 y.o. G1P1001 s/p SVD at [redacted]w[redacted]d.  She reports she is doing well. No acute events overnight. She denies any problems with ambulating, voiding or po intake. Denies nausea or vomiting.  Pain is well controlled.  Lochia is minimal.  Objective: Blood pressure 138/83, pulse 77, temperature 98 F (36.7 C), temperature source Oral, resp. rate 16, height 5\' 5"  (1.651 m), weight 64.4 kg, last menstrual period 04/30/2020, SpO2 98 %, unknown if currently breastfeeding.  Physical Exam:  General: alert, cooperative and no distress Chest: no respiratory distress Heart:regular rate, distal pulses intact Abdomen: soft, nontender,  Uterine Fundus: firm, appropriately tender DVT Evaluation: No calf swelling or tenderness Extremities: Mild peripheral edema Skin: warm, dry  Recent Labs    01/29/21 1328  HGB 11.5*  HCT 35.6*    Assessment/Plan: Jasmin Herman is a 20 y.o. G1P1001 s/p SVD at [redacted]w[redacted]d   #PPD#1 - Doing well  Routine postpartum care #ElevatedBP: BP mildly elevated since delivery, most recently 138/83. No symptoms or meds, prior pre-E labs negative. Norvasc 5mg  started. #Contraception: POPs #Feeding: Breastfeeding  Dispo: Plan for discharge tomorrow, 5/17.   LOS: 1 day  , MD OB Fellow, Faculty [facility] 01/30/2021 8:24 AM

## 2021-01-30 NOTE — Lactation Note (Signed)
This note was copied from a baby's chart. Lactation Consultation Note  Patient Name: Jasmin Herman [id number] Date: 01/30/2021 Reason for consult: Follow-up assessment;Early term 37-38.6wks;Difficult latch Age:22 hours   P1 mother whose infant is now 25 hours old.  This is an ETI at 37+6 weeks.    Mother requested latch assistance.  Assisted to change diaper prior to latching; large meconium stool.  Asked mother to demonstrate hand expression and she was able to express drops which I finger fed back to baby.  Assisted to latch in the football hold on the right breast.  Baby remains sleepy.  Taught mother how to gentle stimulation to keep baby awake during feeding.  Father at bedside and suggested he assist when I am not present.  Reviewed breast feeding basics while observing baby feed on/off for 10 minutes.  A few intermittent swallows noted.  Encouraged to feed 8-12 times/24 hours or sooner if baby shows feeding cues.  Due to gestational age and size offered to initiate the DEBP later this morning if mother is interested.  Mother to call RN/LC if interested in pumping when she awakens this morning. RN updated.   Maternal Data Has patient been taught Hand Expression?: Yes Does the patient have breastfeeding experience prior to this delivery?: No  Feeding Mother's Current Feeding Choice: Breast Milk  LATCH Score Latch: Repeated attempts needed to sustain latch, nipple held in mouth throughout feeding, stimulation needed to elicit sucking reflex.  Audible Swallowing: A few with stimulation  Type of Nipple: Everted at rest and after stimulation (short shafted)  Comfort (Breast/Nipple): Soft / non-tender  Hold (Positioning): Assistance needed to correctly position infant at breast and maintain latch.  LATCH Score: 7   Lactation Tools Discussed/Used    Interventions Interventions: Breast feeding basics reviewed;Assisted with latch;Skin to skin;Breast massage;Hand express;Breast  compression;Adjust position;Position options;Support pillows;Education  Discharge    Consult Status Consult Status: Follow-up Date: 01/31/21 Follow-up type: In-patient    Jasmin Herman 01/30/2021, 5:34 AM

## 2021-01-31 ENCOUNTER — Other Ambulatory Visit (HOSPITAL_COMMUNITY): Payer: Self-pay

## 2021-01-31 MED ORDER — NORETHINDRONE 0.35 MG PO TABS
1.0000 | ORAL_TABLET | Freq: Every day | ORAL | 6 refills | Status: DC
  Filled 2021-01-31: qty 28, 28d supply, fill #0

## 2021-01-31 MED ORDER — AMLODIPINE BESYLATE 5 MG PO TABS
5.0000 mg | ORAL_TABLET | Freq: Every day | ORAL | 0 refills | Status: DC
  Filled 2021-01-31: qty 45, 45d supply, fill #0

## 2021-01-31 MED ORDER — COCONUT OIL OIL
1.0000 | TOPICAL_OIL | 0 refills | Status: DC | PRN
Start: 2021-01-31 — End: 2021-03-13

## 2021-01-31 MED ORDER — ACETAMINOPHEN 325 MG PO TABS: 650.0000 mg | ORAL_TABLET | Freq: Four times a day (QID) | ORAL | Status: DC | PRN

## 2021-01-31 MED ORDER — IBUPROFEN 600 MG PO TABS
600.0000 mg | ORAL_TABLET | Freq: Three times a day (TID) | ORAL | 0 refills | Status: DC | PRN
  Filled 2021-01-31: qty 30, 10d supply, fill #0

## 2021-01-31 NOTE — Lactation Note (Signed)
This note was copied from a baby's chart. Lactation Consultation Note  Patient Name: Jasmin Herman [id number] Date: 01/31/2021 Reason for consult: Follow-up assessment;Mother's request;Difficult latch;Primapara;1st time breastfeeding;Late-preterm 22wks Age:22 hours   I followed up with Jasmin Herman and her 22 hour old daughter, Jasmin Herman. We practiced hand expression, then we gently woke Jasmin Herman to latch her in cradle hold on the left breast. Baby opens her mouth to latch and gave a few suckles, then stopped suckling and held the NS in her mouth. I used some ABM to dot onto the NS to encourage a suckling reflex, but there was no change.  We removed her form the breast, and I did some suck training. Baby has a disorganized suck, but she has some strong suckling bursts. When she suckled well, I used a curved tip syringe to reward her with some ABM. She drained the syringe without assistance.  We then placed her back on the breast. I increased the NS to a size 20 due to maternal anatomy. Baby continued to give a few suckles then pause.  Today's plan: I recommended that she continue to put baby to breast 8-12 times a day with feeding cues. I encouraged parents to do some suck training today using the curved tip syringe for reinforcement. I encouraged Jasmin Herman to post pump 8 times a day and use her EBM first for supplementation. I recommended that they continue to supplement with each feeding until baby is showing a stronger and more sustained suck at the breast.  Parents will follow up with [facility] on Thursday. I recommended that they schedule an OP appointment with the [facility] that is on staff there. I reviewed the [facility] resources as well.   Maternal Data Has patient been taught Hand Expression?: Yes Does the patient have breastfeeding experience prior to this delivery?: No  Feeding Mother's Current Feeding Choice: Breast Milk and Formula  Lactation Tools Discussed/Used Tools: Nipple  Shields Nipple shield size: 16;20 (started with a 16 and ended with a 20) Breast pump type: Double-Electric Breast Pump Pump Education: Setup, frequency, and cleaning Reason for Pumping: stimulation Pumping frequency:  (recommended q3) Pumped volume: 0 mL  Interventions Interventions: Breast feeding basics reviewed;Assisted with latch;Skin to skin;Hand express;Adjust position;Support pillows;Position options;DEBP;Education  Discharge Discharge Education: Outpatient recommendation;Warning signs for feeding baby Pump: DEBP  Consult Status Consult Status: Complete Date: 01/31/21 Follow-up type: In-patient    Walker Shadow 01/31/2021, 10:19 AM

## 2021-01-31 NOTE — Lactation Note (Signed)
This note was copied from a baby's chart. Lactation Consultation Note  Patient Name: Girl Savahna Casados HFSFS'E Date: 01/31/2021 Reason for consult: Follow-up assessment;Early term 37-38.6wks Age:22 hours  I followed up with Ms. Emmick this morning. She states that baby received approximately 30 mls of ABM around [phone] this am. She states that he has difficulty latching and she is supplementing. She states that she's used her breast pump, but that she is not getting any milk from pumping.  I reviewed the purpose of pumping for stimulation and encouraged her to pump every three hours for 15 minutes. I also encouraged her to hand express and to provide drops of her milk to the baby via spoon or finger feeding.   Ms. Scarfone is interested in assistance with latching. I reviewed feeding cues and offered to return at the next feeding.   Maternal Data Has patient been taught Hand Expression?: Yes Does the patient have breastfeeding experience prior to this delivery?: No  Feeding Mother's Current Feeding Choice: Breast Milk and Formula Nipple Type: Extra Slow Flow   Lactation Tools Discussed/Used Tools: Pump Breast pump type: Double-Electric Breast Pump Pump Education: Setup, frequency, and cleaning Reason for Pumping: difficulty latching Pumping frequency:  (last pumped around midnight) Pumped volume: 0 mL  Interventions Interventions: Breast feeding basics reviewed;Education  Discharge Pump: DEBP  Consult Status Consult Status: Follow-up Date: 01/31/21 Follow-up type: In-patient    Walker Shadow 01/31/2021, 8:02 AM

## 2021-01-31 NOTE — Social Work (Signed)
CSW received and acknowledges consult for EDPS of 9.  Consult screened out due to 9 on EDPS does not warrant a CSW consult.  MOB whom scores are greater than 9/yes to question 10 on Edinburgh Postpartum Depression Screen warrants a CSW consult.   Meredyth Hornung, MSW, LCSWA Clinical Social Work [facility] [phone] 

## 2021-02-01 LAB — SURGICAL PATHOLOGY

## 2021-02-06 ENCOUNTER — Ambulatory Visit: Payer: BC Managed Care – PPO

## 2021-02-09 ENCOUNTER — Other Ambulatory Visit: Payer: Self-pay

## 2021-02-09 ENCOUNTER — Ambulatory Visit: Payer: BC Managed Care – PPO

## 2021-02-09 VITALS — BP 114/73 | HR 83

## 2021-02-09 DIAGNOSIS — Z0130 Encounter for examination of blood pressure without abnormal findings: Secondary | ICD-10-CM

## 2021-02-09 NOTE — Progress Notes (Addendum)
..  Subjective:  Jasmin Herman is a 20 y.o. female here for BP check.   Hypertension ROS: taking medications as instructed, no medication side effects noted, no TIA's, no chest pain on exertion, no dyspnea on exertion and no swelling of ankles.    Objective:  BP 114/73   Pulse 83   Appearance alert, well appearing, and in no distress. General exam BP noted to be well controlled today in office.    Assessment:   Blood Pressure well controlled.   Plan:  Current treatment plan is effective, no change in therapy.. Pt is scheduled to return on 03-13-21 for pp visit.  Patient was assessed and managed by nursing staff during this encounter. I have reviewed the chart and agree with the documentation and plan. I have also made any necessary editorial changes.  Coral Ceo, MD 02/10/2021 8:41 AM

## 2021-03-13 ENCOUNTER — Encounter: Payer: Self-pay | Admitting: Nurse Practitioner

## 2021-03-13 ENCOUNTER — Encounter: Payer: Self-pay | Admitting: Obstetrics and Gynecology

## 2021-03-13 ENCOUNTER — Other Ambulatory Visit: Payer: Self-pay

## 2021-03-13 ENCOUNTER — Ambulatory Visit (INDEPENDENT_AMBULATORY_CARE_PROVIDER_SITE_OTHER): Payer: BC Managed Care – PPO | Admitting: Nurse Practitioner

## 2021-03-13 DIAGNOSIS — Z30011 Encounter for initial prescription of contraceptive pills: Secondary | ICD-10-CM

## 2021-03-13 MED ORDER — LEVONORGESTREL-ETHINYL ESTRAD 0.15-30 MG-MCG PO TABS: 1.0000 | ORAL_TABLET | Freq: Every day | ORAL | 2 refills | Status: DC

## 2021-03-13 NOTE — Progress Notes (Addendum)
Post Partum Visit Note  Jasmin Herman is a 20 y.o. G11P1001 female who presents for a postpartum visit. She is 6 weeks postpartum following a normal spontaneous vaginal delivery.  I have fully reviewed the prenatal and intrapartum course. The delivery was at 37.6 gestational weeks.  Anesthesia: epidural. Postpartum course has been unremarkable. Baby is doing well. Baby is feeding by bottle - Enfamil Nutramigen. Bleeding staining only. Bowel function is normal. Bladder function is normal. Patient is not sexually active. Contraception method is OCP (estrogen/progesterone). Postpartum depression screening: negative=3  Reports improvement since the baby was born with mood.  Advised to call the office if mood is worsening.  Patient agrees to call if needed.  Has not yet started Micronor - was told to start after PP visit.  But now that she is no longer breastfeeding or pumping, will change pill to estrogen-progesterone formulation.  Denies migraines or history of blood clots.  Had some hypertension after delivery, but BP is normal today. Advised that she is able to start a LARC method if desired if taking pills daily is a problem.  Given info on IUD in AVS. The pregnancy intention screening data noted above was reviewed. Potential methods of contraception were discussed. The patient elected to proceed with Oral Contraceptive.    Edinburgh Postnatal Depression Scale - 03/13/21 0931       Edinburgh Postnatal Depression Scale:  In the Past 7 Days   I have been able to laugh and see the funny side of things. 0    I have looked forward with enjoyment to things. 0    I have blamed myself unnecessarily when things went wrong. 2    I have been anxious or worried for no good reason. 0    I have felt scared or panicky for no good reason. 0    Things have been getting on top of me. 0    I have been so unhappy that I have had difficulty sleeping. 0    I have felt sad or miserable. 0    I have been so unhappy  that I have been crying. 1    The thought of harming myself has occurred to me. 0    Edinburgh Postnatal Depression Scale Total 3             Health Maintenance Due  Topic Date Due   Pneumococcal Vaccine 82-50 Years old (1 - PCV) Never done   HPV VACCINES (1 - 2-dose series) Never done   COVID-19 Vaccine (2 - Moderna series) 02/28/2020    The following portions of the patient's history were reviewed and updated as appropriate: allergies, current medications, past family history, past medical history, past social history, past surgical history, and problem list.  Review of Systems Pertinent items noted in HPI and remainder of comprehensive ROS otherwise negative.  Objective:  BP 123/83   Pulse 88   Ht 5\' 5"  (1.651 m)   Wt 124 lb (56.2 kg)   LMP 03/09/2021 Comment: OCP  Breastfeeding No   BMI 20.63 kg/m    General:  alert, cooperative, and no distress   Breasts:  deferred  Lungs: clear to auscultation bilaterally  Heart:  regular rate and rhythm, S1, S2 normal, no murmur, click, rub or gallop  Abdomen: deferred    Wound NA  GU exam:  not indicated       Assessment:   Normal postpartum exam.  Started on birth control pills - prescribed today.  Discontinued  Micronor previously prescribed as patient is not breastfeeding.  Plan:   Essential components of care per ACOG recommendations:  1.  Mood and well being: Patient with negative depression screening today. Reviewed local resources for support.  - Patient tobacco use? No.   - hx of drug use? No.    2. Infant care and feeding:  -Patient currently breastmilk feeding? No.  -Social determinants of health (SDOH) reviewed in EPIC. No concerns  3. Sexuality, contraception and birth spacing - Patient does not want a pregnancy in the next year.   - Reviewed forms of contraception in tiered fashion. Patient desired oral contraceptives (estrogen/progesterone) today.   - Discussed birth spacing of 18 months  4. Sleep and  fatigue -Encouraged family/partner/community support of 4 hrs of uninterrupted sleep to help with mood and fatigue  5. Physical Recovery  - Discussed patients delivery and complications. She describes her labor as good. - Patient had a Vaginal problems after delivery including hypertension . Patient had a sulcus laceration. Perineal healing reviewed. Patient expressed understanding - Patient has urinary incontinence? No. - Patient is safe to resume physical and sexual activity  6.  Health Maintenance - HM due items addressed Yes - Last pap smear  Diagnosis  Date Value Ref Range Status  07/11/2020   Final   - Negative for intraepithelial lesion or malignancy (NILM)   Pap smear not done at today's visit.  -Breast Cancer screening indicated? No.   7. Chronic Disease/Pregnancy Condition follow up:  none needed as BP is normal today  - PCP follow up for medical problems Will see back in this office for BP check and pill refill in 3 months.  Currie Paris, NP [facility] [employer], [facility]

## 2021-06-02 ENCOUNTER — Other Ambulatory Visit: Payer: Self-pay | Admitting: Nurse Practitioner

## 2021-06-05 ENCOUNTER — Other Ambulatory Visit: Payer: Self-pay

## 2021-06-05 MED ORDER — LEVONORGESTREL-ETHINYL ESTRAD 0.15-30 MG-MCG PO TABS: 1.0000 | ORAL_TABLET | Freq: Every day | ORAL | 2 refills | Status: DC

## 2021-06-05 NOTE — Progress Notes (Signed)
Pt request refill of BC. Rx sent to pharmacy.

## 2021-06-13 ENCOUNTER — Ambulatory Visit: Payer: BC Managed Care – PPO | Admitting: Advanced Practice Midwife

## 2021-08-21 ENCOUNTER — Other Ambulatory Visit: Payer: Self-pay

## 2021-08-21 DIAGNOSIS — Z3041 Encounter for surveillance of contraceptive pills: Secondary | ICD-10-CM

## 2021-08-21 MED ORDER — LEVONORGESTREL-ETHINYL ESTRAD 0.15-30 MG-MCG PO TABS: 1.0000 | ORAL_TABLET | Freq: Every day | ORAL | 2 refills | Status: DC

## 2021-08-25 ENCOUNTER — Other Ambulatory Visit: Payer: Self-pay | Admitting: Nurse Practitioner

## 2021-08-25 DIAGNOSIS — Z3041 Encounter for surveillance of contraceptive pills: Secondary | ICD-10-CM

## 2021-11-08 ENCOUNTER — Other Ambulatory Visit: Payer: Self-pay

## 2021-11-08 ENCOUNTER — Ambulatory Visit (INDEPENDENT_AMBULATORY_CARE_PROVIDER_SITE_OTHER): Payer: BC Managed Care – PPO | Admitting: Advanced Practice Midwife

## 2021-11-08 ENCOUNTER — Encounter: Payer: Self-pay | Admitting: Advanced Practice Midwife

## 2021-11-08 VITALS — BP 120/70 | HR 85 | Wt 116.6 lb

## 2021-11-08 DIAGNOSIS — Z01419 Encounter for gynecological examination (general) (routine) without abnormal findings: Secondary | ICD-10-CM

## 2021-11-08 DIAGNOSIS — Z3041 Encounter for surveillance of contraceptive pills: Secondary | ICD-10-CM | POA: Insufficient documentation

## 2021-11-08 DIAGNOSIS — Z0000 Encounter for general adult medical examination without abnormal findings: Secondary | ICD-10-CM

## 2021-11-08 NOTE — Progress Notes (Signed)
Pt is in the office for [facility] refill. Last pap 07/11/20.

## 2021-11-08 NOTE — Progress Notes (Signed)
° °  Subjective:     Jasmin Herman is a 21 y.o. female here at [facility] for a routine exam.  Current complaints: none. Pt has moved back to [facility] and plans to establish gyn care there in the future. She is taking OCPs and would like to renew her prescription. She has no difficulty taking the pills daily and no concerning side effects. Menses are lighter and regular with the pills.  Personal health questionnaire reviewed: yes.  Do you have a primary care provider? yes Do you feel safe at home? yes  Flowsheet Row Clinical Support from 06/13/2020 in [facility]  PHQ-2 Total Score 0       Health Maintenance Due  Topic Date Due   HPV VACCINES (1 - 2-dose series) Never done   COVID-19 Vaccine (2 - Moderna series) 02/28/2020   INFLUENZA VACCINE  Never done     Risk factors for chronic health problems: Smoking: Alchohol/how much: Pt BMI: Body mass index is 19.4 kg/m.   Gynecologic History Patient's last menstrual period was 10/19/2021. Contraception: OCP (estrogen/progesterone) Last Pap: October 2021. Results were: normal Last mammogram: n/a.   Obstetric History OB History  Gravida Para Term Preterm AB Living  1 1 1     1   SAB IAB Ectopic Multiple Live Births        0 1    # Outcome Date GA Lbr Len/2nd Weight Sex Delivery Anes PTL Lv  1 Term 01/29/21 [redacted]w[redacted]d 08:33 / 01:32 5 lb 10.5 oz (2.566 kg) F Vag-Spont EPI  LIV     The following portions of the patient's history were reviewed and updated as appropriate: allergies, current medications, past family history, past medical history, past social history, past surgical history, and problem list.  Review of Systems Pertinent items noted in HPI and remainder of comprehensive ROS otherwise negative.    Objective:   BP 120/70    Pulse 85    Wt 116 lb 9.6 oz (52.9 kg)    LMP 10/19/2021    Breastfeeding No    BMI 19.40 kg/m  VS reviewed, nursing note reviewed,  Constitutional: well developed, well nourished,  no distress HEENT: normocephalic CV: normal rate Pulm/chest wall: normal effort Breast Exam:  Deferred with low risks and shared decision making, discussed recommendation to start mammogram between 40-50 yo/  Abdomen: soft Neuro: alert and oriented x 3 Skin: warm, dry Psych: affect normal Pelvic exam: Deferred       Assessment/Plan:   1. Encounter for surveillance of contraceptive pills   2. Well woman exam (no gynecological exam) --Last Pap 06/2020, so due 06/2023  --No gyn concerns or problems --Renew Rx for [employer] OCPs at today's visit, 90 day supply with 4 refills at pt request.     Follow up in: 1  year  or as needed.   07/2023, CNM 10:32 AM

## 2022-05-05 IMAGING — US US OB LIMITED
1 series · 6 of 6 positions shown · non-contrast
Comparison: none

[Series 1: us ob limited · 6 of 6 slices shown]
[im 1/6]
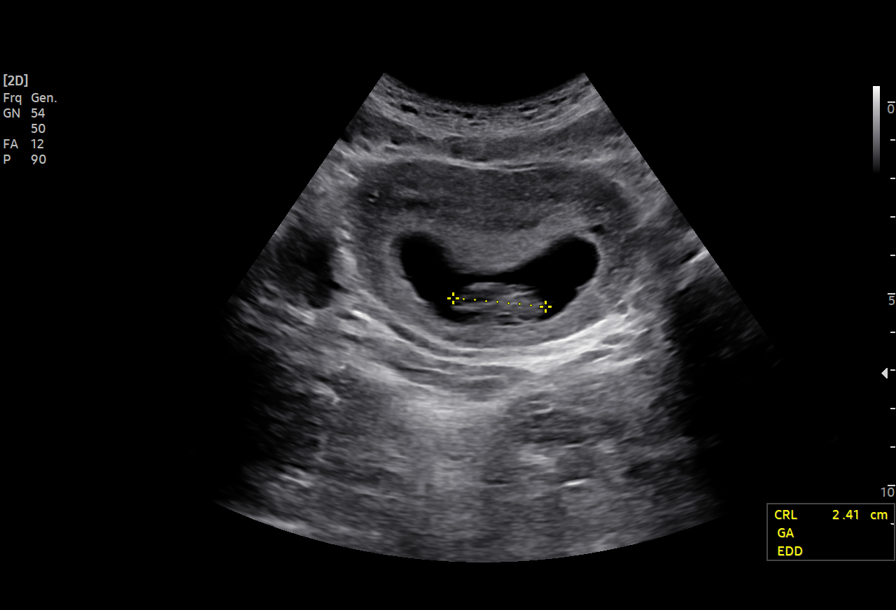
[im 2/6]
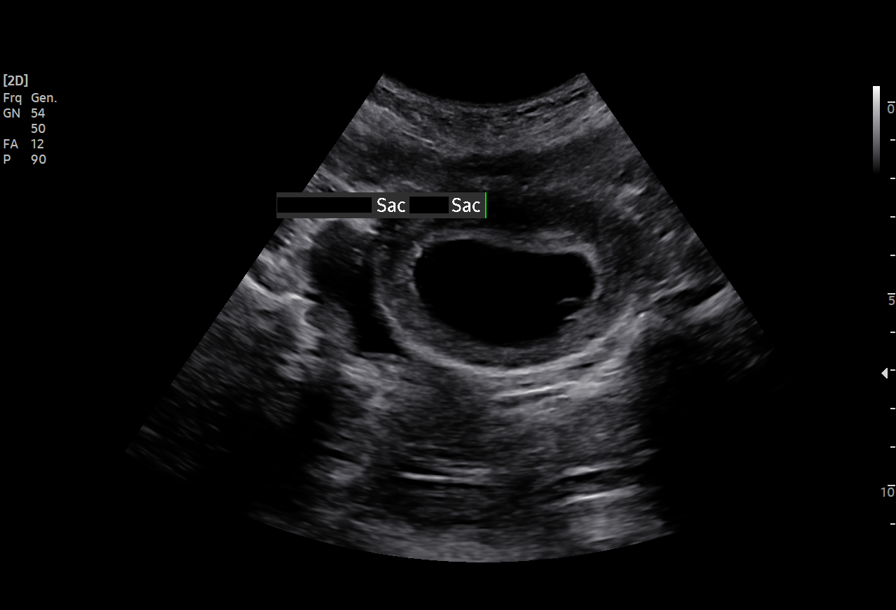
[im 3/6]
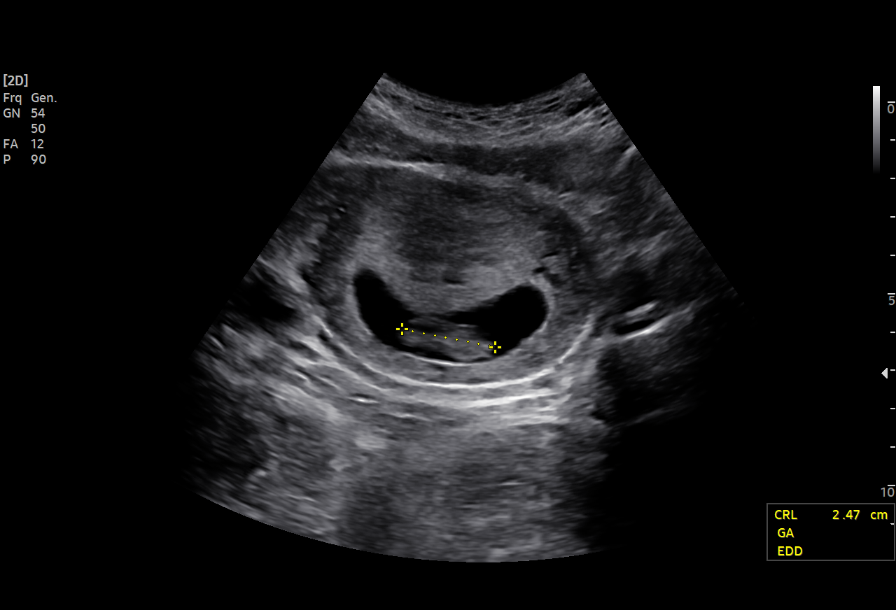
[im 4/6]
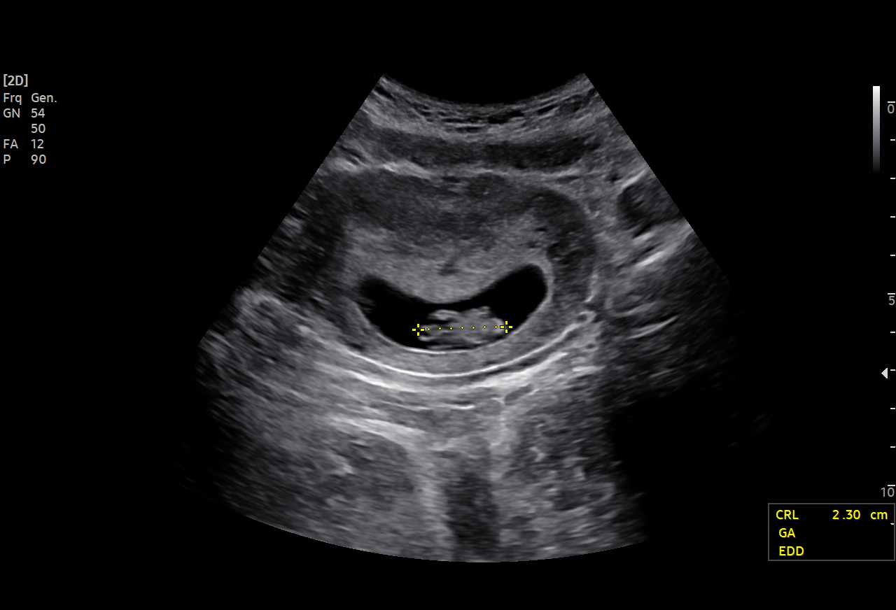
[im 5/6]
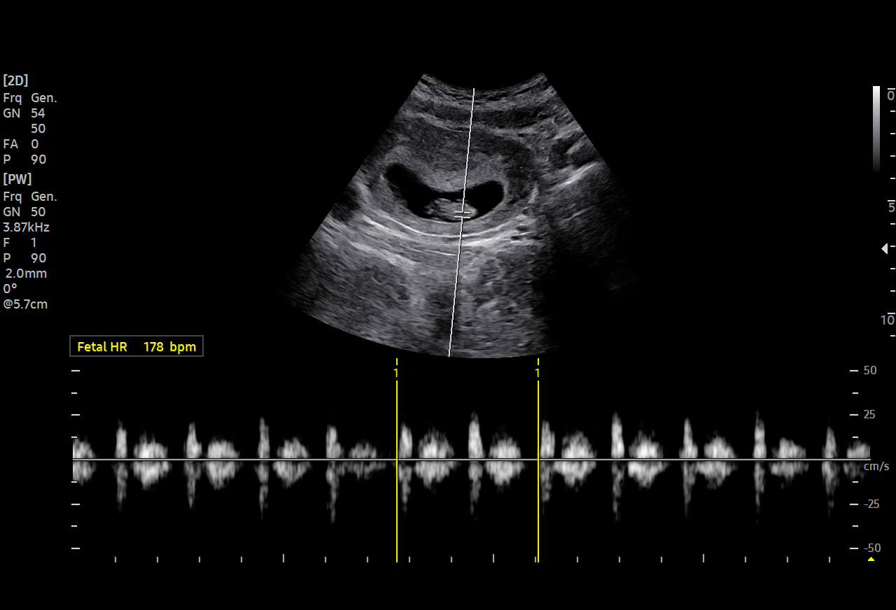
[im 6/6]
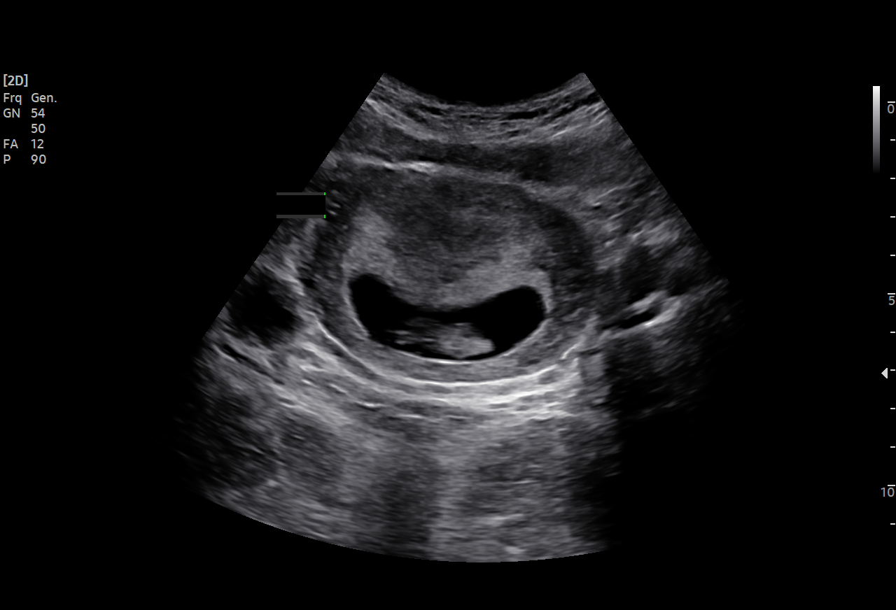

[6 of 6 positions shown; findings below may reference images not displayed]

[REDACTED]care

 1  [HOSPITAL]                         [address].0     [facility]

Indications

 9 weeks gestation of pregnancy
 Unable to hear fetal heart tones as reason     O76
 for ultrasound
Fetal Evaluation

 Num Of Fetuses:         1
 Fetal Heart Rate(bpm):  178
 Cardiac Activity:       Observed
Biometry

 CRL:      23.9  mm     G. Age:  9w 0d                   EDD:   02/13/21
Gestational Age

 Best:          9w 0d      Det. By:  U/S C R L (07/11/20)     EDD:   02/13/21
Comments

 Single live IUP at 9w0d by CRL. FHR 178. LMP 04/30/20
Impression

 [place] live IUP at 9w0d by CRL. Best EDD 02/13/21. Start
 prenatal care.

## 2022-07-19 ENCOUNTER — Encounter: Payer: Self-pay | Admitting: Advanced Practice Midwife
